# Patient Record
Sex: Female | Born: 1985 | Race: White | Hispanic: No | Marital: Married | Smoking: Never smoker
Health system: Southern US, Community
[De-identification: ages and names within clinical notes are randomized; demographics above are authoritative.]

## PROBLEM LIST (undated history)

## (undated) DIAGNOSIS — D131 Benign neoplasm of stomach: Secondary | ICD-10-CM

## (undated) DIAGNOSIS — F419 Anxiety disorder, unspecified: Secondary | ICD-10-CM

## (undated) DIAGNOSIS — F329 Major depressive disorder, single episode, unspecified: Secondary | ICD-10-CM

## (undated) DIAGNOSIS — F32A Depression, unspecified: Secondary | ICD-10-CM

## (undated) DIAGNOSIS — D649 Anemia, unspecified: Secondary | ICD-10-CM

## (undated) DIAGNOSIS — E162 Hypoglycemia, unspecified: Secondary | ICD-10-CM

## (undated) DIAGNOSIS — Z30013 Encounter for initial prescription of injectable contraceptive: Principal | ICD-10-CM

## (undated) DIAGNOSIS — T7840XA Allergy, unspecified, initial encounter: Secondary | ICD-10-CM

## (undated) HISTORY — DX: Anxiety disorder, unspecified: F41.9

## (undated) HISTORY — DX: Benign neoplasm of stomach: D13.1

## (undated) HISTORY — PX: INDUCED ABORTION: SHX677

## (undated) HISTORY — DX: Allergy, unspecified, initial encounter: T78.40XA

## (undated) HISTORY — DX: Major depressive disorder, single episode, unspecified: F32.9

## (undated) HISTORY — DX: Hypoglycemia, unspecified: E16.2

## (undated) HISTORY — DX: Anemia, unspecified: D64.9

## (undated) HISTORY — DX: Depression, unspecified: F32.A

## (undated) HISTORY — DX: Encounter for initial prescription of injectable contraceptive: Z30.013

---
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
## 2004-08-07 ENCOUNTER — Ambulatory Visit: Payer: Self-pay | Admitting: Family Medicine

## 2004-10-01 ENCOUNTER — Emergency Department: Payer: Self-pay | Admitting: Unknown Physician Specialty

## 2004-10-25 ENCOUNTER — Ambulatory Visit: Payer: Self-pay | Admitting: Family Medicine

## 2004-10-25 ENCOUNTER — Other Ambulatory Visit: Admission: RE | Admit: 2004-10-25 | Discharge: 2004-10-25 | Payer: Self-pay | Admitting: Family Medicine

## 2005-01-26 ENCOUNTER — Ambulatory Visit: Payer: Self-pay | Admitting: Family Medicine

## 2005-04-17 ENCOUNTER — Ambulatory Visit: Payer: Self-pay | Admitting: Family Medicine

## 2005-07-10 ENCOUNTER — Ambulatory Visit: Payer: Self-pay | Admitting: Family Medicine

## 2005-09-15 ENCOUNTER — Emergency Department: Payer: Self-pay | Admitting: Unknown Physician Specialty

## 2005-09-25 ENCOUNTER — Ambulatory Visit: Payer: Self-pay | Admitting: Family Medicine

## 2005-12-12 ENCOUNTER — Ambulatory Visit: Payer: Self-pay | Admitting: Family Medicine

## 2006-03-06 ENCOUNTER — Ambulatory Visit: Payer: Self-pay | Admitting: Family Medicine

## 2006-03-26 ENCOUNTER — Other Ambulatory Visit: Admission: RE | Admit: 2006-03-26 | Discharge: 2006-03-26 | Payer: Self-pay | Admitting: Family Medicine

## 2006-03-26 ENCOUNTER — Encounter: Payer: Self-pay | Admitting: Family Medicine

## 2006-03-26 ENCOUNTER — Ambulatory Visit: Payer: Self-pay | Admitting: Family Medicine

## 2006-04-05 ENCOUNTER — Encounter: Payer: Self-pay | Admitting: Family Medicine

## 2006-04-05 LAB — CONVERTED CEMR LAB: Pap Smear: NORMAL

## 2006-04-23 ENCOUNTER — Ambulatory Visit: Payer: Self-pay | Admitting: Family Medicine

## 2006-07-01 ENCOUNTER — Ambulatory Visit: Payer: Self-pay | Admitting: Family Medicine

## 2006-07-12 ENCOUNTER — Encounter: Payer: Self-pay | Admitting: Family Medicine

## 2006-07-12 DIAGNOSIS — E162 Hypoglycemia, unspecified: Secondary | ICD-10-CM | POA: Insufficient documentation

## 2006-07-12 DIAGNOSIS — D649 Anemia, unspecified: Secondary | ICD-10-CM | POA: Insufficient documentation

## 2006-07-14 ENCOUNTER — Encounter: Payer: Self-pay | Admitting: Family Medicine

## 2006-10-04 ENCOUNTER — Telehealth: Payer: Self-pay | Admitting: Family Medicine

## 2006-10-28 ENCOUNTER — Ambulatory Visit: Payer: Self-pay | Admitting: Family Medicine

## 2006-11-06 ENCOUNTER — Ambulatory Visit: Payer: Self-pay | Admitting: Family Medicine

## 2006-11-06 DIAGNOSIS — L255 Unspecified contact dermatitis due to plants, except food: Secondary | ICD-10-CM | POA: Insufficient documentation

## 2006-12-27 ENCOUNTER — Ambulatory Visit: Payer: Self-pay | Admitting: Family Medicine

## 2006-12-27 DIAGNOSIS — J029 Acute pharyngitis, unspecified: Secondary | ICD-10-CM | POA: Insufficient documentation

## 2006-12-27 LAB — CONVERTED CEMR LAB: Rapid Strep: NEGATIVE

## 2007-01-17 ENCOUNTER — Ambulatory Visit: Payer: Self-pay | Admitting: Family Medicine

## 2007-01-17 DIAGNOSIS — N926 Irregular menstruation, unspecified: Secondary | ICD-10-CM | POA: Insufficient documentation

## 2007-01-17 DIAGNOSIS — R350 Frequency of micturition: Secondary | ICD-10-CM | POA: Insufficient documentation

## 2007-01-17 LAB — CONVERTED CEMR LAB
Beta hcg, urine, semiquantitative: NEGATIVE
Bilirubin Urine: NEGATIVE
Blood in Urine, dipstick: NEGATIVE
Casts: 0 /lpf
Glucose, Urine, Semiquant: NEGATIVE
Ketones, urine, test strip: NEGATIVE
Mucus, UA: 0
Nitrite: NEGATIVE
RBC / HPF: 1
Specific Gravity, Urine: 1.015
Urine crystals, microscopic: 0 /hpf
Urobilinogen, UA: 0.2
WBC Urine, dipstick: NEGATIVE
Yeast, UA: 0
pH: 7

## 2007-01-18 ENCOUNTER — Encounter: Payer: Self-pay | Admitting: Family Medicine

## 2007-07-10 ENCOUNTER — Ambulatory Visit: Payer: Self-pay | Admitting: Family Medicine

## 2007-07-10 DIAGNOSIS — M549 Dorsalgia, unspecified: Secondary | ICD-10-CM | POA: Insufficient documentation

## 2007-07-11 ENCOUNTER — Encounter: Admission: RE | Admit: 2007-07-11 | Discharge: 2007-07-11 | Payer: Self-pay | Admitting: Family Medicine

## 2007-07-21 ENCOUNTER — Encounter: Payer: Self-pay | Admitting: Family Medicine

## 2007-07-31 ENCOUNTER — Encounter: Payer: Self-pay | Admitting: Family Medicine

## 2008-08-26 ENCOUNTER — Ambulatory Visit: Payer: Self-pay | Admitting: Family Medicine

## 2008-08-26 DIAGNOSIS — L919 Hypertrophic disorder of the skin, unspecified: Secondary | ICD-10-CM

## 2008-08-26 DIAGNOSIS — L909 Atrophic disorder of skin, unspecified: Secondary | ICD-10-CM | POA: Insufficient documentation

## 2008-08-26 DIAGNOSIS — B079 Viral wart, unspecified: Secondary | ICD-10-CM | POA: Insufficient documentation

## 2009-02-11 ENCOUNTER — Ambulatory Visit: Payer: Self-pay | Admitting: Family Medicine

## 2009-02-11 DIAGNOSIS — F4322 Adjustment disorder with anxiety: Secondary | ICD-10-CM | POA: Insufficient documentation

## 2009-02-15 ENCOUNTER — Telehealth: Payer: Self-pay | Admitting: Family Medicine

## 2009-02-15 ENCOUNTER — Encounter: Payer: Self-pay | Admitting: Family Medicine

## 2009-11-16 ENCOUNTER — Ambulatory Visit: Payer: Self-pay | Admitting: Family Medicine

## 2009-11-16 DIAGNOSIS — N63 Unspecified lump in unspecified breast: Secondary | ICD-10-CM | POA: Insufficient documentation

## 2009-11-23 ENCOUNTER — Encounter: Admission: RE | Admit: 2009-11-23 | Discharge: 2009-11-23 | Payer: Self-pay | Admitting: Family Medicine

## 2010-01-11 ENCOUNTER — Other Ambulatory Visit: Admission: RE | Admit: 2010-01-11 | Discharge: 2010-01-11 | Payer: Self-pay | Admitting: Family Medicine

## 2010-01-11 ENCOUNTER — Ambulatory Visit: Payer: Self-pay | Admitting: Family Medicine

## 2010-01-11 LAB — CONVERTED CEMR LAB
Bilirubin Urine: NEGATIVE
Casts: 0 /lpf
Glucose, Urine, Semiquant: NEGATIVE
Ketones, urine, test strip: NEGATIVE
Nitrite: NEGATIVE
Pap Smear: NORMAL
Specific Gravity, Urine: >=1.03
Urobilinogen, UA: 0.2
WBC Urine, dipstick: NEGATIVE
Yeast, UA: 0
pH: 5

## 2010-01-11 LAB — HM PAP SMEAR

## 2010-01-12 ENCOUNTER — Encounter: Payer: Self-pay | Admitting: Family Medicine

## 2010-01-12 LAB — CONVERTED CEMR LAB
ALT: 12 units/L (ref 0–35)
AST: 17 units/L (ref 0–37)
Albumin: 4 g/dL (ref 3.5–5.2)
Alkaline Phosphatase: 59 units/L (ref 39–117)
BUN: 10 mg/dL (ref 6–23)
Basophils Absolute: 0 10*3/uL (ref 0.0–0.1)
Basophils Relative: 0.6 % (ref 0.0–3.0)
Bilirubin, Direct: 0.1 mg/dL (ref 0.0–0.3)
CO2: 28 meq/L (ref 19–32)
Calcium: 9.1 mg/dL (ref 8.4–10.5)
Chloride: 104 meq/L (ref 96–112)
Cholesterol: 214 mg/dL — ABNORMAL HIGH (ref 0–200)
Creatinine, Ser: 0.7 mg/dL (ref 0.4–1.2)
Direct LDL: 126.1 mg/dL
Eosinophils Absolute: 0 10*3/uL (ref 0.0–0.7)
Eosinophils Relative: 0.6 % (ref 0.0–5.0)
GFR calc non Af Amer: 117.94 mL/min (ref >60)
Glucose, Bld: 80 mg/dL (ref 70–99)
HCT: 37.2 % (ref 36.0–46.0)
HDL: 74 mg/dL (ref >39.00)
Hemoglobin: 12.8 g/dL (ref 12.0–15.0)
Lymphocytes Relative: 21.4 % (ref 12.0–46.0)
Lymphs Abs: 1.1 10*3/uL (ref 0.7–4.0)
MCHC: 34.5 g/dL (ref 30.0–36.0)
MCV: 89.2 fL (ref 78.0–100.0)
Monocytes Absolute: 0.8 10*3/uL (ref 0.1–1.0)
Monocytes Relative: 15.6 % — ABNORMAL HIGH (ref 3.0–12.0)
Neutro Abs: 3.2 10*3/uL (ref 1.4–7.7)
Neutrophils Relative %: 61.8 % (ref 43.0–77.0)
Platelets: 236 10*3/uL (ref 150.0–400.0)
Potassium: 3.8 meq/L (ref 3.5–5.1)
RBC: 4.17 M/uL (ref 3.87–5.11)
RDW: 12.8 % (ref 11.5–14.6)
Sodium: 138 meq/L (ref 135–145)
TSH: 1.13 microintl units/mL (ref 0.35–5.50)
Total Bilirubin: 1 mg/dL (ref 0.3–1.2)
Total CHOL/HDL Ratio: 3
Total Protein: 6.8 g/dL (ref 6.0–8.3)
Triglycerides: 46 mg/dL (ref 0.0–149.0)
VLDL: 9.2 mg/dL (ref 0.0–40.0)
WBC: 5.2 10*3/uL (ref 4.5–10.5)

## 2010-01-19 LAB — CONVERTED CEMR LAB: Pap Smear: NEGATIVE

## 2010-05-04 ENCOUNTER — Ambulatory Visit
Admission: RE | Admit: 2010-05-04 | Discharge: 2010-05-04 | Payer: Self-pay | Source: Home / Self Care | Attending: Family Medicine | Admitting: Family Medicine

## 2010-05-04 ENCOUNTER — Encounter: Payer: Self-pay | Admitting: Family Medicine

## 2010-05-09 NOTE — Assessment & Plan Note (Signed)
Summary: PAP SMEAR AND CPX,CHECK FOR STD, uti/CLE   Vital Signs:  Patient profile:   25 year old female Height:      63 inches Weight:      127.50 pounds BMI:     22.67 Temp:     97.9 degrees F oral Pulse rate:   68 / minute Pulse rhythm:   regular BP sitting:   100 / 70  (left arm) Cuff size:   regular  Vitals Entered By: Lewanda Rife LPN (January 11, 2010 10:46 AM) CC: CPX LMP 12/08/09 UTI frequency of urine, and wants test for STD   History of Present Illness: here for wellness exam and gyn   wt is stable  bp good 100/70  hx of abn pap  pap 12/07- has been a while  needs pap today   Td 07 no flu shot today   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 - no pain from that   has some urinary frequency ua is borderlne today has frequency and big urge to go -- has urge and then does not need to go  once had incontinence some imp with cranberry pills and juice  symptoms are not all the way better  drinking lots of water too   is interested in std screen and hiv  no discharge or pain or other symptoms  no known exposure  wants to be safe  her partner has had vasectomy - no need for OC now   periods are no longer regular since depo  not very heavy or painful   is taking care of herself   Allergies (verified): No Known Drug Allergies  Past History:  Past Medical History: Last updated: 08/26/2008 hx of hypoglycemia   Past Surgical History: Last updated: 07/12/2006 Hx of abnormal pap Ab.  Family History: Last updated: 11/16/2009 mother DM  aunts (both M and P)- with breast cancer   Social History: Last updated: 11/16/2009 non smoker caffiene -- 1 cup of coffee per day   Risk Factors: Smoking Status: never (07/12/2006)  Review of Systems General:  Denies fatigue, fever, loss of appetite, and malaise. Eyes:  Denies blurring and eye irritation. CV:  Denies chest pain or discomfort, fatigue, lightheadness, and palpitations. Resp:  Denies cough and  shortness of breath. GI:  Denies abdominal pain, change in bowel habits, and nausea. GU:  Denies abnormal vaginal bleeding, discharge, dysuria, and urinary frequency. MS:  Denies joint pain, joint redness, and joint swelling. Derm:  Denies itching, lesion(s), poor wound healing, and rash. Neuro:  Denies numbness and tingling. Psych:  mood has been fairly good . Endo:  Denies cold intolerance, excessive thirst, excessive urination, and heat intolerance. Heme:  Denies abnormal bruising and bleeding.  Physical Exam  General:  Well-developed,well-nourished,in no acute distress; alert,appropriate and cooperative throughout examination Head:  normocephalic, atraumatic, and no abnormalities observed.   Eyes:  vision grossly intact, pupils equal, pupils round, and pupils reactive to light.  no conjunctival pallor, injection or icterus  Ears:  R ear normal and L ear normal.   Nose:  no nasal discharge.   Mouth:  pharynx pink and moist.   Neck:  supple with full rom and no masses or thyromegally, no JVD or carotid bruit  Chest Wall:  No deformities, masses, or tenderness noted. Breasts:  breast mass mobile and smooth on R at 9:00  this corresponds with adenoma seen on [place]  no other M, skin change/ or nipple d/c Lungs:  Normal respiratory effort, chest expands  symmetrically. Lungs are clear to auscultation, no crackles or wheezes. Heart:  Normal rate and regular rhythm. S1 and S2 normal without gallop, murmur, click, rub or other extra sounds. Abdomen:  Bowel sounds positive,abdomen soft and non-tender without masses, organomegaly or hernias noted. no suprapubic tenderness or fullness felt  Genitalia:  Normal introitus for age, no external lesions, no vaginal discharge, mucosa pink and moist, no vaginal or cervical lesions, no vaginal atrophy, no friaility or hemorrhage, normal uterus size and position, no adnexal masses or tenderness Msk:  no CVA tenderness  no acute joint changes  Pulses:  R and  L carotid,radial,femoral,dorsalis pedis and posterior tibial pulses are full and equal bilaterally Extremities:  No clubbing, cyanosis, edema, or deformity noted with normal full range of motion of all joints.   Neurologic:  sensation intact to light touch, gait normal, and DTRs symmetrical and normal.   Skin:  Intact without suspicious lesions or rashes Cervical Nodes:  No lymphadenopathy noted Inguinal Nodes:  No significant adenopathy Psych:  normal affect, talkative and pleasant    Impression & Recommendations:  Problem # 1:  HEALTH MAINTENANCE EXAM (ICD-V70.0) Assessment Comment Only reviewed health habits including diet, exercise and skin cancer prevention reviewed health maintenance list and family history  commended of good habits  lab today for wellness  Orders: Venipuncture (16109) TLB-Lipid Panel (80061-LIPID) TLB-BMP (Basic Metabolic Panel-BMET) (80048-METABOL) TLB-CBC Platelet - w/Differential (85025-CBCD) TLB-Hepatic/Liver Function Pnl (80076-HEPATIC) TLB-TSH (Thyroid Stimulating Hormone) (84443-TSH) T-HIV Antibody  (Reflex) ([id number]) T-RPR (Syphilis) (91478-29562)  Problem # 2:  ROUTINE GYNECOLOGICAL EXAMINATION (ICD-V72.31) Assessment: Comment Only annual exam with pap done remote hx of abn pap  will do hpv if abn  Problem # 3:  SCREENING EXAMINATION FOR VENEREAL DISEASE (ICD-V74.5) Assessment: Comment Only pt is asymptomatic and req screening  lab done  gon and chlam performed with pap smear  Orders: Venipuncture (13086) TLB-Lipid Panel (80061-LIPID) TLB-BMP (Basic Metabolic Panel-BMET) (80048-METABOL) TLB-CBC Platelet - w/Differential (85025-CBCD) TLB-Hepatic/Liver Function Pnl (80076-HEPATIC) TLB-TSH (Thyroid Stimulating Hormone) (84443-TSH) T-HIV Antibody  (Reflex) ([id number]) T-RPR (Syphilis) (28413-24401)  Problem # 4:  FREQUENCY, URINARY (ICD-788.41) Assessment: Deteriorated improved with cranberry pills and borderline ua  check ucx   check std tests  then tx if pos Orders: T-Culture, Urine (02725-36644) Specimen Handling (03474) UA Dipstick W/ Micro (manual) (81000)  Complete Medication List: 1)  Prozac 10 Mg Caps (Fluoxetine hcl) .... Take 1 tablet by mouth once a day 2)  Cranberry /mg  .... Take 1 tablet by mouth once a day  Patient Instructions: 1)  labs today  2)  continue healthy diet and exercise 3)  will adv you when pap and labs return  4)  will adv you urine culture returns 5)  keep drinking lots of water  6)  let me know if urine symptoms worsen   Current Allergies (reviewed today): No known allergies   Laboratory Results   Urine Tests  Date/Time Received: January 11, 2010 10:49 AM  Date/Time Reported: January 11, 2010 10:49 AM   Routine Urinalysis   Color: yellow Appearance: Cloudy Glucose: negative   (Normal Range: Negative) Bilirubin: negative   (Normal Range: Negative) Ketone: negative   (Normal Range: Negative) Spec. Gravity: >=1.030   (Normal Range: 1.003-1.035) Blood: trace-lysed   (Normal Range: Negative) pH: 5.0   (Normal Range: 5.0-8.0) Protein: trace   (Normal Range: Negative) Urobilinogen: 0.2   (Normal Range: 0-1) Nitrite: negative   (Normal Range: Negative) Leukocyte Esterace: negative   (Normal Range: Negative)  Urine  Microscopic WBC/HPF: 1-2 RBC/HPF: 0-1 Bacteria/HPF: few Mucous/HPF: few Epithelial/HPF: 0-1 Crystals/HPF: few Casts/LPF: 0 Yeast/HPF: 0 Other: 0

## 2010-05-09 NOTE — Assessment & Plan Note (Signed)
Summary: CHECK WARTS ON HAND,MMG REFERRAL   Vital Signs:  Patient profile:   25 year old female Height:      63 inches Weight:      127.75 pounds BMI:     22.71 Temp:     98.6 degrees F oral Pulse rate:   76 / minute Pulse rhythm:   regular BP sitting:   90 / 56  (left arm) Cuff size:   regular  Vitals Entered By: Lewanda Rife LPN (November 16, 2009 3:52 PM) CC: ck 2 warts on rt hand, also ck tag mole on neck and wants mammogram referral   History of Present Illness: has some new warts on hand-- 2 on R thumb   also skin tag on her neck   did well freezing these in the past   has had lumps in her breasts -- they stay   since 13 has had some breast lumps  thinks she has several  all on R side  no pain (are sore during period)  no nipple d/c or skin change     Allergies (verified): No Known Drug Allergies  Past History:  Past Medical History: Last updated: 08/26/2008 hx of hypoglycemia   Past Surgical History: Last updated: 07/12/2006 Hx of abnormal pap Ab.  Family History: Last updated: 11/16/2009 mother DM  aunts (both M and P)- with breast cancer   Social History: Last updated: 11/16/2009 non smoker caffiene -- 1 cup of coffee per day   Risk Factors: Smoking Status: never (07/12/2006)  Family History: mother DM  aunts (both M and P)- with breast cancer   Social History: non smoker caffiene -- 1 cup of coffee per day   Review of Systems General:  Denies fatigue, fever, and malaise. Eyes:  Denies eye irritation. CV:  Denies chest pain or discomfort and palpitations. Resp:  Denies cough and wheezing. GI:  Denies abdominal pain and change in bowel habits. GU:  Denies genital sores. MS:  Denies joint pain, joint redness, and joint swelling. Derm:  Complains of itching and lesion(s); denies poor wound healing and rash. Neuro:  Denies numbness and tingling. Endo:  Denies excessive thirst and excessive urination. Heme:  Denies abnormal bruising  and bleeding.  Physical Exam  General:  Well-developed,well-nourished,in no acute distress; alert,appropriate and cooperative throughout examination Head:  normocephalic, atraumatic, and no abnormalities observed.   Mouth:  pharynx pink and moist.   Neck:  supple with full rom and no masses or thyromegally, no JVD or carotid bruit  Breasts:  1-2 cm smooth mobile mass lat to R nipple  otherwise no M, skin change or nipple d/c in either breast  breasts are very dense  Lungs:  Normal respiratory effort, chest expands symmetrically. Lungs are clear to auscultation, no crackles or wheezes. Heart:  Normal rate and regular rhythm. S1 and S2 normal without gallop, murmur, click, rub or other extra sounds. Msk:  no acute joint changes  Neurologic:  sensation intact to light touch.   Skin:  irritated skin tag on L side of neck  tx with cryo - tol well   R thumb 2 simple warts  were debrided in sterile fashion  cryo with liquid nitrogen times 3 - full freeze and thaw - pt tolerated well Cervical Nodes:  No lymphadenopathy noted Psych:  normal affect, talkative and pleasant    Impression & Recommendations:  Problem # 1:  BREAST MASS, RIGHT (ICD-611.72) Assessment New lump felt laterally by nipple -may be larger per pt ordered  US and update Orders: Radiology Referral (Radiology)  Problem # 2:  UNSPECIFIED VIRAL WARTS (ICD-078.10) Assessment: New 2 warts on R thumb- tx with cryo tx and debridement pt tolerated well  aftercare disc  Problem # 3:  SKIN TAG (ICD-701.9) Assessment: Unchanged on L neck- is irritated and bothersome  tx with cryotx and well tolerated  Complete Medication List: 1)  Prozac 10 Mg Caps (Fluoxetine hcl) .... Take 1 tablet by mouth once a day  Other Orders: Wart Destruct <14 (17110)  Patient Instructions: 1)  keep warts clean and dry  2)  antibiotic ointment on those and neck tag are ok while healing  3)  we will set up breast imaging appt at check out    4)  then I will update you with a result   Current Allergies (reviewed today): No known allergies

## 2010-05-09 NOTE — Assessment & Plan Note (Signed)
Summary: BACK PAIN/RBH   Vital Signs:  Patient Profile:   25 Years Old Female Height:     63 inches (160.02 cm) Weight:      131 pounds Temp:     98.2 degrees F oral Pulse rate:   76 / minute Pulse rhythm:   regular BP sitting:   110 / 70  (left arm) Cuff size:   regular  Vitals Entered By: Lowella Petties (July 10, 2007 3:54 PM)                 Chief Complaint:  Pain mid lower back.  History of Present Illness: this is an old back problem started 6 mo ago- intermittent low back pain when bending over has progressed to hurting all the time in past 2 weeks- even when still some sharp some dull pain  postion no longer makes difference- but really hurts to walk picking up heavy is difficult  describes area as "where spine and tailbone cross" no pain in legs no numbness in legs or feet, no weakness no change in urination- no incontinence no fever- feels fine  has not taken any medcine -trying not to since it hurts all time (advil did not help to begin with) helps a little to lie down  no better or worse in am- but gets more stiff the more she sits still  LMP 2 mo ago no chance pregnant      Current Allergies: No known allergies    Family History:    Reviewed history from 07/12/2006 and no changes required:       mother DM  Social History:    non smoker    Review of Systems  General      Denies malaise, weakness, and weight loss.  CV      Denies chest pain or discomfort and palpitations.  Resp      Denies cough.  GI      Denies nausea and vomiting.  GU      Denies dysuria and urinary frequency.  MS      Denies joint pain, joint redness, and joint swelling.  Derm      Denies changes in color of skin and rash.  Neuro      Denies numbness and weakness.  Psych      mood is ok    Physical Exam  General:     Well-developed,well-nourished,in no acute distress; alert,appropriate and cooperative throughout examination Head:  normocephalic, atraumatic, and no abnormalities observed.   Eyes:     vision grossly intact, pupils equal, and pupils round.   Neck:     nl rom without tenderness Chest Wall:     No deformities, masses, or tenderness noted. Lungs:     Normal respiratory effort, chest expands symmetrically. Lungs are clear to auscultation, no crackles or wheezes. Heart:     Normal rate and regular rhythm. S1 and S2 normal without gallop, murmur, click, rub or other extra sounds. Abdomen:     soft and non-tender.  no suprapubic tenderness or fullness  Msk:     no scolisois or lordosis some LS tenderness [id number] flex 90 deg, ext 10 deg with pain nl twist some pain on L lat bend SLR nl  nl rom hips Pulses:     R and L carotid,radial,femoral,dorsalis pedis and posterior tibial pulses are full and equal bilaterally Extremities:     No clubbing, cyanosis, edema, or deformity noted with normal full range of motion of all joints.  Neurologic:     strength normal in all extremities, sensation intact to light touch, gait normal, and DTRs symmetrical and normal.   Skin:     Intact without suspicious lesions or rashes Cervical Nodes:     No lymphadenopathy noted Psych:     normal affect, talkative and pleasant     Impression & Recommendations:  Problem # 1:  BACK PAIN (ICD-724.5) Assessment: New low back pain- ongoing and consistent with sprain/strain- but also some bony tenderness on exam will try heat- and handout given on low back pain naproxen two times a day and flexeril as needed x ray and update Orders: Diagnostic X-Ray/Fluoroscopy (Diagnostic X-Ray/Flu)  Her updated medication list for this problem includes:    Naprosyn 500 Mg Tabs (Naproxen) .Marland Kitchen... 1 by mouth two times a day with food for 2 weeks    Flexeril 10 Mg Tabs (Cyclobenzaprine hcl) .Marland Kitchen... 1 by mouth three times a day as needed back pain/spasm   Complete Medication List: 1)  Naprosyn 500 Mg Tabs (Naproxen) .Marland Kitchen.. 1 by mouth two  times a day with food for 2 weeks 2)  Flexeril 10 Mg Tabs (Cyclobenzaprine hcl) .Marland Kitchen.. 1 by mouth three times a day as needed back pain/spasm   Patient Instructions: 1)  we will refer you for low back x ray when you check out- in [place] 2)  use some heat on your back 3)  try not to do any heavy lifting or stay still for a long time  4)  use the naproxen with food twice daily for 2 weeks 5)  flexeril (muscle relaxer)- may help more severe pain- but use caution because it can sedate 6)  we will make a plan when x ray results return    Prescriptions: FLEXERIL 10 MG  TABS (CYCLOBENZAPRINE HCL) 1 by mouth three times a day as needed back pain/spasm  #30 x 0   Entered and Authorized by:   Judith Part MD   Signed by:   Judith Part MD on 07/10/2007   Method used:   Print then Give to Patient   RxID:   [id number] NAPROSYN 500 MG  TABS (NAPROXEN) 1 by mouth two times a day with food for 2 weeks  #60 x 0   Entered and Authorized by:   Judith Part MD   Signed by:   Judith Part MD on 07/10/2007   Method used:   Print then Give to Patient   RxID:   [id number]  ] Prior Medications: NAPROSYN 500 MG  TABS (NAPROXEN) 1 by mouth two times a day with food for 2 weeks FLEXERIL 10 MG  TABS (CYCLOBENZAPRINE HCL) 1 by mouth three times a day as needed back pain/spasm Current Allergies: No known allergies

## 2010-05-17 NOTE — Assessment & Plan Note (Signed)
Summary: MMR/RI  Nurse Visit   Allergies: No Known Drug Allergies  Immunizations Administered:  MMR Vaccine # 2:    Vaccine Type: MMR    Site: left deltoid    Mfr: Merck    Dose: 0.5 ml    Route: Vanduser    Given by: Lewanda Rife LPN    Exp. Date: 06/23/2011    Lot #: [id number]    VIS given: 06/20/06 version given May 04, 2010.  Orders Added: 1)  MMR Vaccine SQ [90707] 2)  Admin 1st Vaccine [32440]

## 2010-05-17 NOTE — Miscellaneous (Signed)
Summary: Immunizations  Immunizations   Imported By: Kassie Mends 05/12/2010 10:39:01  _____________________________________________________________________  External Attachment:    Type:   Image     Comment:   External Document

## 2010-08-16 ENCOUNTER — Telehealth: Payer: Self-pay | Admitting: *Deleted

## 2010-08-16 ENCOUNTER — Encounter: Payer: Self-pay | Admitting: Family Medicine

## 2010-08-16 NOTE — Telephone Encounter (Signed)
Pt is asking for a note for work stating that she needs to use her portable stand up fan everyday, all day.  She works at the [employer] and it is very hot in her work area.  She says she needs this to avoid heat exhaustion, and they wont let her use it unless she has a work note.  Please call her when the note is ready.

## 2010-08-16 NOTE — Telephone Encounter (Signed)
Done and in IN box 

## 2010-08-16 NOTE — Telephone Encounter (Signed)
Patient notified as instructed by telephone.Letter left at front desk for pick up.

## 2010-11-13 ENCOUNTER — Telehealth: Payer: Self-pay | Admitting: *Deleted

## 2010-11-13 NOTE — Telephone Encounter (Signed)
The patient says she had secured a gym membership to work out at [employer] and that now she is having significant back problems that is not allowing her to work out.  She has been seen here for this problem and has been sent to a specialist.  The gym is holding her financially responsible for her membership unless she gets a form filled out from her MD saying that she is disabled and unable to use the membership.  She has the form and would like to know if you would fill it out or if she needs an appt with you before she brings in the form.

## 2010-11-13 NOTE — Telephone Encounter (Signed)
Patient notified as instructed by telephone. Pt scheduled appt with Dr Milinda Antis 11/20/10 at 11:45am and will bring form and [facility] records with pt. If back pain starts to bother her before Monday pt will call back.

## 2010-11-13 NOTE — Telephone Encounter (Signed)
Need to follow up for that please Also make sure we have the notes from doctor we referred to  thanks

## 2010-11-15 ENCOUNTER — Encounter: Payer: Self-pay | Admitting: Family Medicine

## 2010-11-20 ENCOUNTER — Ambulatory Visit (INDEPENDENT_AMBULATORY_CARE_PROVIDER_SITE_OTHER): Payer: BC Managed Care – PPO | Admitting: Family Medicine

## 2010-11-20 ENCOUNTER — Encounter: Payer: Self-pay | Admitting: Family Medicine

## 2010-11-20 VITALS — BP 104/66 | HR 80 | Temp 98.6°F | Ht 63.0 in | Wt 142.2 lb

## 2010-11-20 DIAGNOSIS — M549 Dorsalgia, unspecified: Secondary | ICD-10-CM

## 2010-11-20 NOTE — Patient Instructions (Signed)
Please make appt with Dr Patsy Lager for back pain up front Use gentle heat and stretch as much as you can  Update me in meantime if symptoms worsen

## 2010-11-20 NOTE — Assessment & Plan Note (Addendum)
Ongoing since 2009 -- but now worse when she tried to work out at [employer] (machines and cardio) Now constant pain - moreso in piriformis area No neuro s/s Rev ortho notes 2009- was briefly  Of pars def but reassuring MRI  Pt has not done PT  Will ref to sport [facility] Forms filled out to get out of gym contract now

## 2010-11-20 NOTE — Progress Notes (Signed)
Subjective:    Patient ID: Karen Sanchez, female    DOB: 03/18/1986, 25 y.o.   MRN: [id number]  HPI Signed up for [employer] just a month ago - in a 2 year contract  Every time she goes her low back pain flares up really bad  Any physical activity bothers it   Also bothers her a lot at work - she walks all day  Also going up stairs  Chronic low back pain - on and off - feels like on the bone -- both sides   Thought exercise may help her back but she was wrong  Was running and not using a trainer  Then used treadmill and bikes   In past [facility] -- did not find a reason for it  xr - showed ? Pars deficit but MRI was normal   She has learned to deal with the pain  Lateral bend hurts the wost Works at the [employer]- does have to lift mail She wants to get out of her gym contract and consider another form of exercise  Patient Active Problem List  Diagnoses  . Viral warts, unspecified  . HYPOGLYCEMIA  . ANEMIA  . ADJUSTMENT DISORDER WITH ANXIETY  . SORE THROAT  . BREAST MASS, RIGHT  . IRREGULAR MENSTRUATION  . DERMATITIS, CONTACT, DUE TO PLANTS  . SKIN TAG  . BACK PAIN  . FREQUENCY, URINARY   Past Medical History  Diagnosis Date  . Hypoglycemia    No past surgical history on file. History  Substance Use Topics  . Smoking status: Never Smoker   . Smokeless tobacco: Not on file  . Alcohol Use: No   Family History  Problem Relation Age of Onset  . Diabetes Mother   . Cancer Maternal Aunt     breast  . Cancer Paternal Aunt     breast   No Known Allergies Current Outpatient Prescriptions on File Prior to Visit  Medication Sig Dispense Refill  . FLUoxetine (PROZAC) 10 MG capsule Take 10 mg by mouth daily.        . Vitamins C E (CRANBERRY CONCENTRATE PO) Take 1 tablet by mouth daily.             Review of Systems Review of Systems  Constitutional: Negative for fever, appetite change, fatigue and unexpected weight change.  Eyes: Negative for pain and  visual disturbance.  Respiratory: Negative for cough and shortness of breath.   Cardiovascular: Negative.  For cp or palpitations Gastrointestinal: Negative for nausea, diarrhea and constipation.  Genitourinary: Negative for urgency and frequency.  Skin: Negative for pallor. pr rasj  MSK pos for low back pain  Neurological: Negative for weakness, light-headedness, numbness and headaches.  Hematological: Negative for adenopathy. Does not bruise/bleed easily.  Psychiatric/Behavioral: Negative for dysphoric mood. The patient is not nervous/anxious.          Objective:   Physical Exam  Constitutional: She appears well-developed and well-nourished. No distress.  HENT:  Head: Normocephalic and atraumatic.  Mouth/Throat: Oropharynx is clear and moist.  Eyes: Conjunctivae and EOM are normal. Pupils are equal, round, and reactive to light.  Neck: Normal range of motion. Neck supple. No thyromegaly present.       No bony tenderness  Cardiovascular: Normal rate, regular rhythm and normal heart sounds.   Pulmonary/Chest: Effort normal and breath sounds normal. No respiratory distress. She has no wheezes.  Abdominal: Soft. Bowel sounds are normal.  Musculoskeletal: She exhibits no edema.  Mild bony tenderness over lower lumbar vertebrae Also muscular tenderness lateral lumbar both sides to the piriformis muscle Nl rom hips Neg SLR Gait is labored Flex spine 30 deg with pain but full extension Pain to flex laterally as well  Lymphadenopathy:    She has no cervical adenopathy.  Neurological: She is alert. She has normal strength and normal reflexes. No sensory deficit. She exhibits normal muscle tone. Coordination normal.  Skin: Skin is warm and dry. No rash noted. No erythema. No pallor.  Psychiatric: She has a normal mood and affect.          Assessment & Plan:

## 2010-11-22 ENCOUNTER — Institutional Professional Consult (permissible substitution): Payer: BC Managed Care – PPO | Admitting: Family Medicine

## 2010-11-27 ENCOUNTER — Telehealth: Payer: Self-pay | Admitting: Family Medicine

## 2010-11-29 ENCOUNTER — Ambulatory Visit (INDEPENDENT_AMBULATORY_CARE_PROVIDER_SITE_OTHER): Payer: BC Managed Care – PPO | Admitting: Family Medicine

## 2010-11-29 ENCOUNTER — Encounter: Payer: Self-pay | Admitting: Family Medicine

## 2010-11-29 VITALS — BP 110/70 | HR 74 | Temp 98.5°F | Ht 63.0 in | Wt 141.8 lb

## 2010-11-29 DIAGNOSIS — M533 Sacrococcygeal disorders, not elsewhere classified: Secondary | ICD-10-CM

## 2010-11-29 DIAGNOSIS — M999 Biomechanical lesion, unspecified: Secondary | ICD-10-CM

## 2010-11-29 DIAGNOSIS — M549 Dorsalgia, unspecified: Secondary | ICD-10-CM

## 2010-11-29 NOTE — Progress Notes (Signed)
Subjective:    Patient ID: Karen Sanchez, female    DOB: April 30, 1985, 25 y.o.   MRN: [id number]  HPI  Karen Sanchez, a 25 y.o. female presents today in the office for the following:    Pleasant patient - Dr. Milinda Antis requested a consultation regarding back pain.   Since 2009 - the patient has been having back pain off and on since this time. In the past, several years ago, she saw Dr. August Saucer from [facility] orthopedics. She did do some oral anti-inflammatories and some muscle relaxants, which made her fairly drowsy. Initially on her plain films, there was some can serve for possible pars defect, but this was followed up on MRI, and that was  Not evident. There is no evidence of pars defect or T2 signal in this area on her MRI of her spine.  Back pain sensations are there about 4 days a week.  Sometimes will get disoriented with balance Sometimes will make her cry. Feels better to stand than sit. Always around in the same area. Sometimes in her bottom area. Pretty much is there. Almost anything can make it worse.   Has done some heating pad. 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  Goes to [employer]  --- wants to do 5th grade.   At least twice a week Walk or will run. No more than 2 miles. Recently she has not done this as much do to time limitations, but in the past, she would do some  Run walking for a couple of miles several times a week.  Denies any numbness or tingling. Denies any bowel or bladder incontinence. Denies any focal weakness.  Currently points to the area at the [facility] joint bilaterally. This is the area of primary pain.  She has not tried any chiropractic manipulation, acupuncture, physical therapy, rubs or physical modalities. He does minimal core or abdominal work  The PMH, PSH, Social History, Family History, Medications, and allergies have been reviewed in [facility], and have been updated if relevant.  Review of Systems REVIEW OF SYSTEMS  GEN: No fevers, chills.  Nontoxic. Primarily MSK c/o today. MSK: Detailed in the HPI GI: tolerating PO intake without difficulty Neuro: No numbness, parasthesias, or tingling associated. Otherwise the pertinent positives of the ROS are noted above.      Objective:   Physical Exam   Physical Exam  Blood pressure 110/70, pulse 74, temperature 98.5 F (36.9 C), temperature source Oral, height 5\' 3"  (1.6 m), weight 141 lb 12.8 oz (64.32 kg), SpO2 100.00%.  Gen: Well-developed,well-nourished,in no acute distress; alert,appropriate and cooperative throughout examination HEENT: Normocephalic and atraumatic without obvious abnormalities.  Ears, externally no deformities Pulm: Breathing comfortably in no respiratory distress Range of motion at  the waist: Flexion: normal Extension: normal Lateral bending: normal Rotation: all normal  No echymosis or edema Rises to examination table with no difficulty Gait: non antalgic  Inspection/Deformity: N Paraspinus T: NT  B Ankle Dorsiflexion (L5,4): 5/5 B Great Toe Dorsiflexion (L5,4): 5/5 Heel Walk (L5): WNL Toe Walk (S1): WNL Rise/Squat (L4): WNL  SENSORY B Medial Foot (L4): WNL B Dorsum (L5): WNL B Lateral (S1): WNL Light Touch: WNL Pinprick: WNL  REFLEXES Knee (L4): 2+ Ankle (S1): 2+  B SLR, seated: neg B SLR, supine: neg B FABER: neg B Reverse FABER: neg B Greater Troch: NT B Log Roll: neg B Stork: NT B Sciatic Notch: markedly tender to palpation bilaterally. Leg Lengths: equal       Assessment &  Plan:   1. SI (sacroiliac) joint dysfunction   2. BACK PAIN    Recommendations:  Interesting case. Right now and historically her SI joints or the point of maximal pain. On her, you can feel these well. She really has no functional symptoms around her paraspinous musculature and spinal erector musculature which is more common. 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  Reviewed some self SI joint maneuvers and stretches  For the patient to do as well as some poor conditioning. She is very young and healthy, and I think that she could push this pretty hard on her own. In SI joint dysfunction, there is very good evidence that chiropractic manipulation can be very helpful, and I recommended that the patient see a chiropractor. She is going to check with her insurance. I recommended that she see [facility] in [place] if she can.   If that is not an option from her insurance standpoint, I think it doing some basic Thea Silversmith protocol rehabilitation with the spine therapists would be a good idea as well, and I asked him to do some SI joint manipulation in that case.  Recheck with me in 6 weeks. If she is still doing poorly, think that he would be a reasonable try to see if a SI joint injection would help

## 2010-11-29 NOTE — Patient Instructions (Signed)
Sacroiliac Joint Mobilization and Rehab 1. Work on pretzel stretching, shoulder back and leg draped in front. 3-5 sets, 30 sec.. 2. hip abductor rotations. standing, hip flexion and rotation outward then inward. 3 sets, 15 reps. when can do comfortably, add ankle weights starting at 2 pounds.  3. cross over stretching - shoulder back to ground, same side leg crossover. 3-5 sets for 30 min..  4. rolling up and back knees to chest and rocking.  Ab exercises 3 x a week  CALL INSURANCE ABOUT CHIROPRACTOR  F/u in 6 weeks

## 2010-11-30 DIAGNOSIS — M533 Sacrococcygeal disorders, not elsewhere classified: Secondary | ICD-10-CM | POA: Insufficient documentation

## 2010-12-13 ENCOUNTER — Telehealth: Payer: Self-pay | Admitting: *Deleted

## 2010-12-13 NOTE — Telephone Encounter (Signed)
Pt has dropped off a form that she needs completed to have her gym membership cancelled.  Form is on your shelf.

## 2010-12-13 NOTE — Telephone Encounter (Signed)
Patient notified as instructed by telephone.completed form left at front desk.Copy of form and letter sent to be scanned.

## 2010-12-13 NOTE — Telephone Encounter (Signed)
Done and in IN box No charge - it was just a signature Please copy to scan also

## 2011-01-17 ENCOUNTER — Emergency Department (HOSPITAL_COMMUNITY)
Admission: EM | Admit: 2011-01-17 | Discharge: 2011-01-18 | Disposition: A | Discharge status: Home / Self Care | Payer: BC Managed Care – PPO | Attending: Emergency Medicine | Admitting: Emergency Medicine

## 2011-01-17 DIAGNOSIS — J069 Acute upper respiratory infection, unspecified: Secondary | ICD-10-CM | POA: Insufficient documentation

## 2011-01-17 DIAGNOSIS — R0989 Other specified symptoms and signs involving the circulatory and respiratory systems: Secondary | ICD-10-CM | POA: Insufficient documentation

## 2011-01-17 DIAGNOSIS — IMO0001 Reserved for inherently not codable concepts without codable children: Secondary | ICD-10-CM | POA: Insufficient documentation

## 2011-01-17 DIAGNOSIS — H9209 Otalgia, unspecified ear: Secondary | ICD-10-CM | POA: Insufficient documentation

## 2011-01-17 DIAGNOSIS — R0602 Shortness of breath: Secondary | ICD-10-CM | POA: Insufficient documentation

## 2011-01-17 DIAGNOSIS — R0609 Other forms of dyspnea: Secondary | ICD-10-CM | POA: Insufficient documentation

## 2011-01-17 DIAGNOSIS — R059 Cough, unspecified: Secondary | ICD-10-CM | POA: Insufficient documentation

## 2011-01-17 DIAGNOSIS — J3489 Other specified disorders of nose and nasal sinuses: Secondary | ICD-10-CM | POA: Insufficient documentation

## 2011-01-17 DIAGNOSIS — R509 Fever, unspecified: Secondary | ICD-10-CM | POA: Insufficient documentation

## 2011-01-17 DIAGNOSIS — R5381 Other malaise: Secondary | ICD-10-CM | POA: Insufficient documentation

## 2011-01-17 DIAGNOSIS — R05 Cough: Secondary | ICD-10-CM | POA: Insufficient documentation

## 2011-01-17 DIAGNOSIS — R070 Pain in throat: Secondary | ICD-10-CM | POA: Insufficient documentation

## 2011-01-18 ENCOUNTER — Emergency Department (HOSPITAL_COMMUNITY): Payer: BC Managed Care – PPO

## 2011-01-18 ENCOUNTER — Telehealth: Payer: Self-pay | Admitting: *Deleted

## 2011-01-18 NOTE — Telephone Encounter (Signed)
Patient called and said she has the flu. She said she had been using Afrin to help with head congestion. She called and said she had a non stop nose bleed now and didn't know what to do. I advised her to pinch the bridge of her nose and lean forward. I also advised to use an ice pack over her nose. She said she has been having headaches recently as well, which she wasn't sure if they were related to congestion or not. I advised to go to [facility] or ER to make sure it wasn't her BP. I told her that her nose may also require packing which we don't have to capability to do here. She verbalized understanding and said she would go get evaluated.   Sent to Dr Sharen Hones in Dr. Royden Purl absence and to Dr. Milinda Antis for [facility].

## 2011-01-18 NOTE — Telephone Encounter (Signed)
Agree.  If bleeding past 15 min, would need evaluation. Afrin usually helps stop bleed, not cause bleed.

## 2011-01-23 ENCOUNTER — Ambulatory Visit (INDEPENDENT_AMBULATORY_CARE_PROVIDER_SITE_OTHER): Payer: BC Managed Care – PPO | Admitting: Family Medicine

## 2011-01-23 ENCOUNTER — Encounter: Payer: Self-pay | Admitting: Family Medicine

## 2011-01-23 VITALS — BP 116/72 | HR 80 | Temp 98.1°F | Wt 136.2 lb

## 2011-01-23 DIAGNOSIS — J329 Chronic sinusitis, unspecified: Secondary | ICD-10-CM

## 2011-01-23 MED ORDER — AMOXICILLIN-POT CLAVULANATE 875-125 MG PO TABS: 1.0000 | ORAL_TABLET | Freq: Two times a day (BID) | ORAL | Status: AC

## 2011-01-23 NOTE — Progress Notes (Signed)
  Subjective:    Patient ID: Karen Sanchez, female    DOB: 28-Oct-1985, 25 y.o.   MRN: [id number]  HPI CC: f/u ER visit  sxs started last Monday (9 days ago) with head and sinus congestion, gradually getting worse.  Went to ER on Wednesday (records reviewed), 2/2 trouble with breathing and swollen throat.  Given shot (?prednisone).  Started on afrin nasal spray - caused nose bleed so stopped.  Placed on azithromycin and given tussionex which made her dizzy so stopped.  CXR normal.  Head congestion, nose drainage, ears popping, poor appetite, tired and fatigued continued.  + myalgia/arthralgia/body aches.  Cough about same, some mucous.  Significant RN with mucous and some blood tinged.  No frank epistaxis.    No fevers/chills, rashes, vomiting.  No smokers at home, no sick contacts.  No h/o asthma, seasonal allergies.  Review of Systems Per HPI    Objective:   Physical Exam  Nursing note and vitals reviewed. Constitutional: She appears well-developed and well-nourished. No distress.  HENT:  Head: Normocephalic and atraumatic.  Right Ear: Hearing, tympanic membrane, external ear and ear canal normal.  Left Ear: Hearing, tympanic membrane, external ear and ear canal normal.  Nose: Mucosal edema and rhinorrhea present. Right sinus exhibits maxillary sinus tenderness. Right sinus exhibits no frontal sinus tenderness. Left sinus exhibits maxillary sinus tenderness. Left sinus exhibits no frontal sinus tenderness.  Mouth/Throat: Uvula is midline, oropharynx is clear and moist and mucous membranes are normal. No oropharyngeal exudate, posterior oropharyngeal edema, posterior oropharyngeal erythema or tonsillar abscesses.       mild max sinus pressure  Eyes: Conjunctivae and EOM are normal. Pupils are equal, round, and reactive to light. No scleral icterus.  Neck: Normal range of motion. Neck supple.  Cardiovascular: Normal rate, regular rhythm, normal heart sounds and intact distal pulses.   No  murmur heard. Pulmonary/Chest: Effort normal and breath sounds normal. No respiratory distress. She has no wheezes. She has no rales.  Musculoskeletal: She exhibits no edema.  Lymphadenopathy:    She has no cervical adenopathy.  Skin: Skin is warm and dry. No rash noted.  Psychiatric: She has a normal mood and affect.          Assessment & Plan:

## 2011-01-23 NOTE — Assessment & Plan Note (Signed)
Recently treatd with zpack. Given progression of sxs and duration, anticipate bacterial sinusitis.  will treat with augmentin, recommended mucinex. Update if not improving as expected.

## 2011-01-23 NOTE — Patient Instructions (Signed)
You have a sinus infection. Take medicine as prescribed: augmentin twice daily for 10 days. Push fluids and plenty of rest. May use simple mucinex with plenty of fluid to help mobilize mucous. Return if fever >101.5, trouble opening/closing mouth, difficulty swallowing, or worsening.  Update [place] if not improving as expected.

## 2012-02-01 ENCOUNTER — Ambulatory Visit (INDEPENDENT_AMBULATORY_CARE_PROVIDER_SITE_OTHER): Payer: BC Managed Care – PPO | Admitting: Family Medicine

## 2012-02-01 ENCOUNTER — Encounter: Payer: Self-pay | Admitting: Family Medicine

## 2012-02-01 VITALS — BP 96/68 | HR 76 | Temp 97.9°F | Ht 63.0 in | Wt 135.0 lb

## 2012-02-01 DIAGNOSIS — Z3049 Encounter for surveillance of other contraceptives: Secondary | ICD-10-CM

## 2012-02-01 DIAGNOSIS — Z3042 Encounter for surveillance of injectable contraceptive: Secondary | ICD-10-CM | POA: Insufficient documentation

## 2012-02-01 DIAGNOSIS — J019 Acute sinusitis, unspecified: Secondary | ICD-10-CM

## 2012-02-01 LAB — POCT URINE PREGNANCY: Preg Test, Ur: NEGATIVE

## 2012-02-01 MED ORDER — MEDROXYPROGESTERONE ACETATE 150 MG/ML IM SUSP
150.0000 mg | Freq: Once | INTRAMUSCULAR | Status: AC
  Administered 2012-02-01: 150 mg via INTRAMUSCULAR

## 2012-02-01 MED ORDER — FLUTICASONE PROPIONATE 50 MCG/ACT NA SUSP: 2.0000 | Freq: Every day | NASAL | Status: DC

## 2012-02-01 MED ORDER — AMOXICILLIN 500 MG PO CAPS: 500.0000 mg | ORAL_CAPSULE | Freq: Three times a day (TID) | ORAL | Status: DC

## 2012-02-01 NOTE — Patient Instructions (Addendum)
Take amoxicillin for sinus infection flonase nasal spray to open up ears  Drink lots of fluids First depo shot today  On the way out schedule a physical with gyn exam / pap

## 2012-02-01 NOTE — Assessment & Plan Note (Signed)
With uri and allergies- pt is prone to these Also some ETD tx with amox and also flonase Disc symptomatic care - see instructions on AVS  Update if not starting to improve in a week or if worsening

## 2012-02-01 NOTE — Progress Notes (Signed)
Subjective:    Patient ID: Karen Sanchez, female    DOB: 07/10/1985, 26 y.o.   MRN: [id number]  HPI Here for ear pain and desiring depo provera  Both ears hurt - 3-4 days Post nasal drip / throat hurts  No fever  No chills or aches Some runny / stuffy nose Some sinus pain above eyes No purulent drainage   Wants to start depo  LMP was early this month  No chance pregnant Has been on depo before - for 5 years , not a lot of side effects Tends to stop her period entirely   Has not had pap in a while - will schedule that   Not a smoker   Patient Active Problem List  Diagnosis  . HYPOGLYCEMIA  . ANEMIA  . ADJUSTMENT DISORDER WITH ANXIETY  . IRREGULAR MENSTRUATION  . SKIN TAG  . BACK PAIN  . FREQUENCY, URINARY  . SI (sacroiliac) joint dysfunction  . Sinusitis   Past Medical History  Diagnosis Date  . Hypoglycemia    No past surgical history on file. History  Substance Use Topics  . Smoking status: Never Smoker   . Smokeless tobacco: Not on file  . Alcohol Use: No   Family History  Problem Relation Age of Onset  . Diabetes Mother   . Cancer Maternal Aunt     breast  . Cancer Paternal Aunt     breast   No Known Allergies Current Outpatient Prescriptions on File Prior to Visit  Medication Sig Dispense Refill  . sertraline (ZOLOFT) 100 MG tablet Take 1 tablet by mouth daily.      . traZODone (DESYREL) 50 MG tablet Take 1 tablet by mouth at bedtime as needed.          Review of Systems Review of Systems  Constitutional: Negative for fever, appetite change, fatigue and unexpected weight change.  Eyes: Negative for pain and visual disturbance.  ENT pos for ear pain and sinus pain and cong Respiratory: Negative for wheeze and shortness of breath.   Cardiovascular: Negative for cp or palpitations    Gastrointestinal: Negative for nausea, diarrhea and constipation.  Genitourinary: Negative for urgency and frequency.  Skin: Negative for pallor or rash     Neurological: Negative for weakness, light-headedness, numbness and headaches.  Hematological: Negative for adenopathy. Does not bruise/bleed easily.  Psychiatric/Behavioral: Negative for dysphoric mood. The patient is not nervous/anxious.         Objective:   Physical Exam  Constitutional: She appears well-developed and well-nourished. No distress.  HENT:  Head: Normocephalic and atraumatic.  Left Ear: External ear normal.  Mouth/Throat: Oropharynx is clear and moist. No oropharyngeal exudate.       TMs clear but slt dull (small eff) Nares are injected and congested  Bilateral maxillary sinus tenderness  Eyes: Conjunctivae normal and EOM are normal. Pupils are equal, round, and reactive to light. Right eye exhibits no discharge. Left eye exhibits no discharge.  Neck: Normal range of motion. Neck supple.  Cardiovascular: Normal rate and regular rhythm.   Pulmonary/Chest: Effort normal and breath sounds normal. No respiratory distress.  Abdominal: Soft. Bowel sounds are normal. She exhibits no distension and no mass. There is no tenderness.       No suprapubic tenderness or fullness    Musculoskeletal: She exhibits no edema.  Lymphadenopathy:    She has no cervical adenopathy.  Neurological: She is alert. No cranial nerve deficit.  Skin: Skin is warm and dry. No rash noted.  No erythema.  Psychiatric: She has a normal mood and affect.          Assessment & Plan:

## 2012-02-01 NOTE — Assessment & Plan Note (Signed)
Urine preg neg and first shot today Pt has had it in the past without problems Will schedule PE with pap- is due

## 2012-04-18 ENCOUNTER — Ambulatory Visit (INDEPENDENT_AMBULATORY_CARE_PROVIDER_SITE_OTHER): Payer: BC Managed Care – PPO | Admitting: *Deleted

## 2012-04-18 DIAGNOSIS — Z309 Encounter for contraceptive management, unspecified: Secondary | ICD-10-CM

## 2012-04-18 DIAGNOSIS — IMO0001 Reserved for inherently not codable concepts without codable children: Secondary | ICD-10-CM

## 2012-04-18 NOTE — Progress Notes (Signed)
After I called pt from waiting room to the exam room pt stated that she didn't want the depo injection and she wanted to cancel this appt., but wanted Dr. Milinda Antis to look at her foot, I advise pt that this is a nurse visit and she would have to schedule a regular appt with Dr. Milinda Antis. I advise pt that she would have to check with front office to see how to get this appt canceled because I don't handle that. Pt stated that she had already paid her Co-pay so she wasn't going to cancel the appt she just left. Spoke with Hansel Starling and Carlena Sax about how to charge pt, they said advise me that they will check into it and let me know

## 2012-05-02 ENCOUNTER — Other Ambulatory Visit: Payer: Self-pay

## 2012-05-08 ENCOUNTER — Other Ambulatory Visit: Payer: Self-pay

## 2012-05-13 ENCOUNTER — Encounter: Payer: Self-pay | Admitting: Family Medicine

## 2012-05-13 ENCOUNTER — Ambulatory Visit (INDEPENDENT_AMBULATORY_CARE_PROVIDER_SITE_OTHER): Payer: BC Managed Care – PPO | Admitting: Family Medicine

## 2012-05-13 VITALS — BP 116/80 | HR 96 | Temp 98.7°F | Ht 63.0 in | Wt 135.0 lb

## 2012-05-13 DIAGNOSIS — B9689 Other specified bacterial agents as the cause of diseases classified elsewhere: Secondary | ICD-10-CM

## 2012-05-13 DIAGNOSIS — H6692 Otitis media, unspecified, left ear: Secondary | ICD-10-CM

## 2012-05-13 DIAGNOSIS — J019 Acute sinusitis, unspecified: Secondary | ICD-10-CM

## 2012-05-13 DIAGNOSIS — H6690 Otitis media, unspecified, unspecified ear: Secondary | ICD-10-CM

## 2012-05-13 MED ORDER — AMOXICILLIN-POT CLAVULANATE 875-125 MG PO TABS: 1.0000 | ORAL_TABLET | Freq: Two times a day (BID) | ORAL | Status: DC

## 2012-05-13 NOTE — Assessment & Plan Note (Signed)
With sinus pain/ purulent drainage and also L OM  tx with augmentin Disc symptomatic care - see instructions on AVS  Update if not starting to improve in a week or if worsening

## 2012-05-13 NOTE — Progress Notes (Signed)
  Subjective:    Patient ID: Karen Sanchez, female    DOB: 11-15-1985, 27 y.o.   MRN: [id number]  HPI Here with sinus symptoms  Thinks more than a cold  3 weeks of symptoms  Started with sinus pressure and congestion/ runny and stuffy nose/ cough / ST Facial pain  L ear - is plugged/ congested with pain and dec hearing Pain has rad to her teeth as well   No fever  Drainage is green   Cough is not too bad  Is taking -- mucinex day and night/ tried mucinex D also  Nyquil/ day quil  Not a lot of help  No analgesics   Patient Active Problem List  Diagnosis  . HYPOGLYCEMIA  . ANEMIA  . ADJUSTMENT DISORDER WITH ANXIETY  . IRREGULAR MENSTRUATION  . SKIN TAG  . BACK PAIN  . FREQUENCY, URINARY  . SI (sacroiliac) joint dysfunction  . Sinusitis  . Acute sinusitis  . Depot contraception   Past Medical History  Diagnosis Date  . Hypoglycemia    No past surgical history on file. History  Substance Use Topics  . Smoking status: Never Smoker   . Smokeless tobacco: Not on file  . Alcohol Use: No   Family History  Problem Relation Age of Onset  . Diabetes Mother   . Cancer Maternal Aunt     breast  . Cancer Paternal Aunt     breast   No Known Allergies Current Outpatient Prescriptions on File Prior to Visit  Medication Sig Dispense Refill  . traZODone (DESYREL) 50 MG tablet Take 1 tablet by mouth at bedtime as needed.          Review of Systems Review of Systems  Constitutional: Negative for unexpected weight change. pos for fever/ malaise ENT pos for sinus pain/ cong/ ST and ear pain, neg for ear drainage Eyes: Negative for pain and visual disturbance.  Respiratory: Negative for wheeze  and shortness of breath.   Cardiovascular: Negative for cp or palpitations    Gastrointestinal: Negative for nausea, diarrhea and constipation.  Genitourinary: Negative for urgency and frequency.  Skin: Negative for pallor or rash   Neurological: Negative for weakness,  light-headedness, numbness and headaches.  Hematological: Negative for adenopathy. Does not bruise/bleed easily.  Psychiatric/Behavioral: Negative for dysphoric mood. The patient is not nervous/anxious.         Objective:   Physical Exam  Constitutional: She appears well-developed and well-nourished. No distress.  HENT:  Head: Normocephalic and atraumatic.  Right Ear: External ear normal.  Mouth/Throat: Oropharynx is clear and moist. No oropharyngeal exudate.       Nares are injected and congested  bilat ethmoid and maxillary sinus tenderness  L TM is retracted/ dull erythematous   Eyes: Conjunctivae normal and EOM are normal. Pupils are equal, round, and reactive to light. Right eye exhibits no discharge. Left eye exhibits no discharge.  Neck: Normal range of motion. Neck supple.  Cardiovascular: Normal rate and regular rhythm.   Pulmonary/Chest: Effort normal and breath sounds normal. No respiratory distress. She has no wheezes. She has no rales.  Lymphadenopathy:    She has no cervical adenopathy.  Neurological: She is alert.  Skin: Skin is warm and dry. No rash noted.  Psychiatric: She has a normal mood and affect.          Assessment & Plan:

## 2012-05-13 NOTE — Assessment & Plan Note (Signed)
Significant with sinusitis tx with augmentin Suggested ibuprofen for pain  Update if not starting to improve in a week or if worsening

## 2012-05-13 NOTE — Patient Instructions (Addendum)
You have a sinus and ear infection Take augmentin as directed  Continue mucinex product  Also try some ibuprofen as directed with food  Saline nasal spray can also help along with warm compresses on face Update if not starting to improve in a week or if worsening

## 2012-05-24 ENCOUNTER — Other Ambulatory Visit: Payer: Self-pay

## 2012-05-27 ENCOUNTER — Telehealth: Payer: Self-pay

## 2012-05-27 NOTE — Telephone Encounter (Signed)
I would love to re check her ear if possible - she may just have residual sinus congestion that will take a while to resolve but if her ear is still red - we may need to extend abx  Please make f/u - let me know if no openings, could put her in at 4:15 today or tomorrow, thanks

## 2012-05-27 NOTE — Telephone Encounter (Signed)
Pt seen on 05/13/12; pt has finished antibiotic; ear is better but still popping on and off (like changing altitude). Pt still has decreased hearing and sinus pressure and congestion. Pt has no pain, fever, cough or S/T now. Pt did not know if should get more med or should be rechecked.[facility] [address]Please advise.

## 2012-05-27 NOTE — Telephone Encounter (Signed)
Pt scheduled appt for tomorrow @ 4:15

## 2012-05-28 ENCOUNTER — Encounter: Payer: Self-pay | Admitting: Family Medicine

## 2012-05-28 ENCOUNTER — Ambulatory Visit (INDEPENDENT_AMBULATORY_CARE_PROVIDER_SITE_OTHER): Payer: Federal, State, Local not specified - PPO | Admitting: Family Medicine

## 2012-05-28 VITALS — BP 106/76 | HR 78 | Temp 98.4°F | Ht 63.0 in | Wt 135.8 lb

## 2012-05-28 DIAGNOSIS — H6980 Other specified disorders of Eustachian tube, unspecified ear: Secondary | ICD-10-CM

## 2012-05-28 MED ORDER — FLUTICASONE PROPIONATE 50 MCG/ACT NA SUSP: 2.0000 | Freq: Every day | NASAL | Status: DC

## 2012-05-28 NOTE — Patient Instructions (Addendum)
Start flonase for sinus and ear congestion If pain or fever let me know  mucinex D is fine if you need it  In the spring - an antihistamine like claritin/ allegra/ zyrtec are ok for runny nose as well

## 2012-05-28 NOTE — Progress Notes (Signed)
  Subjective:    Patient ID: Karen Sanchez, female    DOB: 09-27-1985, 27 y.o.   MRN: [id number]  HPI Here for f/u of OM  Took augmentin 10 days  Ear feels full, hearing is still not 100% Pops when she sneezes or swallows  No longer any pain   Sinuses still congested  Was taking mucinex D   Patient Active Problem List  Diagnosis  . HYPOGLYCEMIA  . ANEMIA  . ADJUSTMENT DISORDER WITH ANXIETY  . IRREGULAR MENSTRUATION  . SKIN TAG  . BACK PAIN  . FREQUENCY, URINARY  . SI (sacroiliac) joint dysfunction  . Depot contraception  . Acute bacterial sinusitis  . Left otitis media   Past Medical History  Diagnosis Date  . Hypoglycemia    No past surgical history on file. History  Substance Use Topics  . Smoking status: Never Smoker   . Smokeless tobacco: Not on file  . Alcohol Use: No   Family History  Problem Relation Age of Onset  . Diabetes Mother   . Cancer Maternal Aunt     breast  . Cancer Paternal Aunt     breast   No Known Allergies Current Outpatient Prescriptions on File Prior to Visit  Medication Sig Dispense Refill  . sertraline (ZOLOFT) 100 MG tablet Take 100 mg by mouth daily.      . traZODone (DESYREL) 50 MG tablet Take 1 tablet by mouth at bedtime as needed.       No current facility-administered medications on file prior to visit.      Review of Systems Review of Systems  Constitutional: Negative for fever, appetite change, fatigue and unexpected weight change.  ENT pos for some sinus congestion and drip/ neg for st or sinus pain  Eyes: Negative for pain and visual disturbance.  Respiratory: Negative for cough and shortness of breath.   Cardiovascular: Negative for cp or palpitations    Gastrointestinal: Negative for nausea, diarrhea and constipation.  Genitourinary: Negative for urgency and frequency.  Skin: Negative for pallor or rash   Neurological: Negative for weakness, light-headedness, numbness and headaches.  Hematological: Negative  for adenopathy. Does not bruise/bleed easily.  Psychiatric/Behavioral: Negative for dysphoric mood. The patient is not nervous/anxious.         Objective:   Physical Exam  Constitutional: She appears well-developed and well-nourished. No distress.  HENT:  Head: Normocephalic and atraumatic.  Right Ear: External ear normal.  Left Ear: External ear normal.  Mouth/Throat: Oropharynx is clear and moist. No oropharyngeal exudate.  Nares are boggy but clear Complete resolution of OM on L  TMs are slt retracted   Eyes: Conjunctivae and EOM are normal. Pupils are equal, round, and reactive to light. Right eye exhibits no discharge. Left eye exhibits no discharge.  Neck: Normal range of motion. Neck supple.  Cardiovascular: Normal rate and regular rhythm.   Pulmonary/Chest: Effort normal and breath sounds normal.  Lymphadenopathy:    She has no cervical adenopathy.  Skin: Skin is warm and dry. No rash noted.  Psychiatric: She has a normal mood and affect.          Assessment & Plan:

## 2012-05-29 NOTE — Assessment & Plan Note (Signed)
With resolution of sinusitis/ uri and OM L Will tx with flonase Antihistamine otc in addition in allergy season  Update if not starting to improve in a week or if worsening

## 2012-06-25 ENCOUNTER — Encounter: Payer: BC Managed Care – PPO | Admitting: Family Medicine

## 2012-10-31 ENCOUNTER — Encounter: Payer: Federal, State, Local not specified - PPO | Admitting: Family Medicine

## 2013-01-07 ENCOUNTER — Other Ambulatory Visit: Payer: Self-pay

## 2013-02-12 ENCOUNTER — Other Ambulatory Visit: Payer: Self-pay

## 2013-03-11 ENCOUNTER — Ambulatory Visit (INDEPENDENT_AMBULATORY_CARE_PROVIDER_SITE_OTHER): Payer: Federal, State, Local not specified - PPO | Admitting: Family Medicine

## 2013-03-11 ENCOUNTER — Encounter: Payer: Self-pay | Admitting: *Deleted

## 2013-03-11 ENCOUNTER — Other Ambulatory Visit (HOSPITAL_COMMUNITY)
Admission: RE | Admit: 2013-03-11 | Discharge: 2013-03-11 | Disposition: A | Discharge status: Home / Self Care | Payer: Federal, State, Local not specified - PPO | Source: Ambulatory Visit | Attending: Family Medicine | Admitting: Family Medicine

## 2013-03-11 ENCOUNTER — Encounter: Payer: Self-pay | Admitting: Family Medicine

## 2013-03-11 VITALS — BP 94/76 | HR 86 | Temp 98.2°F | Ht 62.56 in | Wt 147.8 lb

## 2013-03-11 DIAGNOSIS — Z209 Contact with and (suspected) exposure to unspecified communicable disease: Secondary | ICD-10-CM

## 2013-03-11 DIAGNOSIS — Z0000 Encounter for general adult medical examination without abnormal findings: Secondary | ICD-10-CM

## 2013-03-11 DIAGNOSIS — Z01419 Encounter for gynecological examination (general) (routine) without abnormal findings: Secondary | ICD-10-CM | POA: Insufficient documentation

## 2013-03-11 NOTE — Progress Notes (Signed)
Subjective:    Patient ID: Karen Sanchez, female    DOB: 30-Jul-1985, 27 y.o.   MRN: [id number]  HPI Here for health maintenance exam and to review chronic medical problems    Having some post nasal drainage and congestion - and mucinex does help  Itches in her head - thinks it is allergies She used to take claritin- needs to take it more often  Steroid nasal sprays give her nosebleeds   Wt is up 12 lb with bmi of 26  Flu vaccine - declines flu shots   Pap 10/11-has not been to gyn in a long time  No plans to get pregnant  husb had a vasectomy  Periods are monthly and fairly regular - not too long and not too heavy  She gets by pretty well   Mood - with current medicines - is improved - just started pristiq back for her  She is happy with that and trazadone  Dr Delle Reining is her psychiatrist   Td 12/07  Needs labs - will do those today  She is interested in screening for hepatitis C (her father has it) - he has had it lifelong and has cirrhosis  She has no symptoms -never had jaundice   Eats too much - and has gained weight  Is getting regular exercise   Patient Active Problem List   Diagnosis Date Noted  . Routine general medical examination at a health care facility 03/11/2013  . ETD (eustachian tube dysfunction) 05/28/2012  . Depot contraception 02/01/2012  . SI (sacroiliac) joint dysfunction 11/30/2010  . 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 SKIN TAG 08/26/2008  . BACK PAIN 07/10/2007  . IRREGULAR MENSTRUATION 01/17/2007  . FREQUENCY, URINARY 01/17/2007  . HYPOGLYCEMIA 07/12/2006  . ANEMIA 07/12/2006   Past Medical History  Diagnosis Date  . Hypoglycemia    No past surgical history on file. History  Substance Use Topics  . Smoking status: Never Smoker   . Smokeless tobacco: Never Used  . Alcohol Use: No   Family History  Problem Relation Age of Onset  . Diabetes Mother   . Cancer Maternal Aunt     breast  . Cancer Paternal Aunt     breast    No Known Allergies Current Outpatient Prescriptions on File Prior to Visit  Medication Sig Dispense Refill  . traZODone (DESYREL) 50 MG tablet Take 1 tablet by mouth at bedtime as needed.       No current facility-administered medications on file prior to visit.      Review of Systems Review of Systems  Constitutional: Negative for fever, appetite change, fatigue and unexpected weight change.  Eyes: Negative for pain and visual disturbance.  Respiratory: Negative for cough and shortness of breath.   Cardiovascular: Negative for cp or palpitations    Gastrointestinal: Negative for nausea, diarrhea and constipation. neg for abd pain or jaundice  Genitourinary: Negative for urgency and frequency. neg for abn menses  Skin: Negative for pallor or rash   Neurological: Negative for weakness, light-headedness, numbness and headaches.  Hematological: Negative for adenopathy. Does not bruise/bleed easily.  Psychiatric/Behavioral: pos for dep/anx that are controlled and tx by psychiatrist .         Objective:   Physical Exam  Constitutional: She appears well-developed and well-nourished. No distress.  HENT:  Head: Normocephalic and atraumatic.  Right Ear: External ear normal.  Left Ear: External ear normal.  Nose: Nose normal.  Mouth/Throat: Oropharynx is clear and moist.  Eyes:  Conjunctivae and EOM are normal. Pupils are equal, round, and reactive to light. Right eye exhibits no discharge. Left eye exhibits no discharge. No scleral icterus.  Neck: Normal range of motion. Neck supple. No JVD present. No thyromegaly present.  Cardiovascular: Normal rate, regular rhythm, normal heart sounds and intact distal pulses.  Exam reveals no gallop.   Pulmonary/Chest: Effort normal and breath sounds normal. No respiratory distress. She has no wheezes. She has no rales.  Abdominal: Soft. Bowel sounds are normal. She exhibits no distension and no mass. There is no tenderness.  Genitourinary: Vagina  normal and uterus normal. No breast swelling, tenderness, discharge or bleeding. There is no rash, tenderness or lesion on the right labia. There is no rash, tenderness or lesion on the left labia. Uterus is not enlarged and not tender. Cervix exhibits no motion tenderness, no discharge and no friability. Right adnexum displays no mass, no tenderness and no fullness. Left adnexum displays no mass, no tenderness and no fullness. No bleeding around the vagina. No vaginal discharge found.  Breast exam: No mass, nodules, thickening, tenderness, bulging, retraction, inflamation, nipple discharge or skin changes noted.  No axillary or clavicular LA.  Chaperoned exam.    Musculoskeletal: She exhibits no edema and no tenderness.  Lymphadenopathy:    She has no cervical adenopathy.  Neurological: She is alert. She has normal reflexes. No cranial nerve deficit. She exhibits normal muscle tone. Coordination normal.  Skin: Skin is warm and dry. No rash noted. No erythema. No pallor.  Psychiatric: She has a normal mood and affect.          Assessment & Plan:

## 2013-03-11 NOTE — Patient Instructions (Signed)
Lab today  Try claritin or zyrtec otc - for allergy/sinus symptoms  Stay active and try to eat a healthy diet  Pap done today

## 2013-03-11 NOTE — Telephone Encounter (Signed)
This encounter was created in error - please disregard.

## 2013-03-11 NOTE — Progress Notes (Signed)
Pre-visit discussion using our clinic review tool. No additional management support is needed unless otherwise documented below in the visit note.  

## 2013-03-12 LAB — CBC WITH DIFFERENTIAL/PLATELET
Basophils Absolute: 0.1 10*3/uL (ref 0.0–0.1)
Basophils Relative: 1.2 % (ref 0.0–3.0)
Eosinophils Relative: 1.3 % (ref 0.0–5.0)
HCT: 39.6 % (ref 36.0–46.0)
Hemoglobin: 13.3 g/dL (ref 12.0–15.0)
Lymphs Abs: 1.8 10*3/uL (ref 0.7–4.0)
Monocytes Relative: 11.8 % (ref 3.0–12.0)
Neutro Abs: 4.1 10*3/uL (ref 1.4–7.7)
RBC: 4.63 Mil/uL (ref 3.87–5.11)
RDW: 12.9 % (ref 11.5–14.6)

## 2013-03-12 LAB — LIPID PANEL
Cholesterol: 212 mg/dL — ABNORMAL HIGH (ref 0–200)
HDL: 60.7 mg/dL (ref >39.00)
Triglycerides: 91 mg/dL (ref 0.0–149.0)

## 2013-03-12 LAB — COMPREHENSIVE METABOLIC PANEL
BUN: 9 mg/dL (ref 6–23)
CO2: 29 mEq/L (ref 19–32)
Calcium: 9.2 mg/dL (ref 8.4–10.5)
Creatinine, Ser: 0.8 mg/dL (ref 0.4–1.2)
GFR: 97.61 mL/min (ref >60.00)
Glucose, Bld: 84 mg/dL (ref 70–99)
Total Bilirubin: 0.6 mg/dL (ref 0.3–1.2)

## 2013-03-12 LAB — LDL CHOLESTEROL, DIRECT: Direct LDL: 139.2 mg/dL

## 2013-03-12 LAB — TSH: TSH: 1.58 u[IU]/mL (ref 0.35–5.50)

## 2013-03-12 NOTE — Assessment & Plan Note (Signed)
Reviewed health habits including diet and exercise and skin cancer prevention Reviewed appropriate screening tests for age  Also reviewed health mt list, fam hx and immunization status , as well as social and family history   See hpi Lab today

## 2013-03-12 NOTE — Assessment & Plan Note (Signed)
Exam with pap  No problems or complaints

## 2013-03-12 NOTE — Assessment & Plan Note (Signed)
Father has hep C Pt wants to be screened for hepatitis No symptoms

## 2013-03-13 LAB — HEPATITIS PANEL, ACUTE: Hep A IgM: NONREACTIVE

## 2013-05-13 ENCOUNTER — Ambulatory Visit (INDEPENDENT_AMBULATORY_CARE_PROVIDER_SITE_OTHER): Payer: Federal, State, Local not specified - PPO | Admitting: Family Medicine

## 2013-05-13 ENCOUNTER — Encounter: Payer: Self-pay | Admitting: Family Medicine

## 2013-05-13 VITALS — BP 80/52 | HR 67 | Temp 97.8°F | Ht 62.5 in | Wt 147.2 lb

## 2013-05-13 DIAGNOSIS — R110 Nausea: Secondary | ICD-10-CM | POA: Insufficient documentation

## 2013-05-13 LAB — BASIC METABOLIC PANEL
BUN: 10 mg/dL (ref 6–23)
CHLORIDE: 103 meq/L (ref 96–112)
CO2: 29 mEq/L (ref 19–32)
Calcium: 9 mg/dL (ref 8.4–10.5)
Creatinine, Ser: 0.8 mg/dL (ref 0.4–1.2)
GFR: 91.82 mL/min (ref >60.00)
Glucose, Bld: 60 mg/dL — ABNORMAL LOW (ref 70–99)
POTASSIUM: 3.6 meq/L (ref 3.5–5.1)
Sodium: 139 mEq/L (ref 135–145)

## 2013-05-13 LAB — CBC WITH DIFFERENTIAL/PLATELET
BASOS ABS: 0 10*3/uL (ref 0.0–0.1)
BASOS PCT: 0.2 % (ref 0.0–3.0)
EOS ABS: 0.1 10*3/uL (ref 0.0–0.7)
Eosinophils Relative: 1.2 % (ref 0.0–5.0)
HCT: 40.7 % (ref 36.0–46.0)
Hemoglobin: 13.3 g/dL (ref 12.0–15.0)
Lymphocytes Relative: 28.1 % (ref 12.0–46.0)
Lymphs Abs: 1.5 10*3/uL (ref 0.7–4.0)
MCHC: 32.7 g/dL (ref 30.0–36.0)
MCV: 87.2 fl (ref 78.0–100.0)
MONO ABS: 0.7 10*3/uL (ref 0.1–1.0)
Monocytes Relative: 13.2 % — ABNORMAL HIGH (ref 3.0–12.0)
NEUTROS PCT: 57.3 % (ref 43.0–77.0)
Neutro Abs: 3 10*3/uL (ref 1.4–7.7)
PLATELETS: 287 10*3/uL (ref 150.0–400.0)
RBC: 4.67 Mil/uL (ref 3.87–5.11)
RDW: 13 % (ref 11.5–14.6)
WBC: 5.2 10*3/uL (ref 4.5–10.5)

## 2013-05-13 LAB — HEPATIC FUNCTION PANEL
ALBUMIN: 3.9 g/dL (ref 3.5–5.2)
ALT: 11 U/L (ref 0–35)
AST: 13 U/L (ref 0–37)
Alkaline Phosphatase: 75 U/L (ref 39–117)
BILIRUBIN DIRECT: 0 mg/dL (ref 0.0–0.3)
TOTAL PROTEIN: 6.9 g/dL (ref 6.0–8.3)
Total Bilirubin: 0.8 mg/dL (ref 0.3–1.2)

## 2013-05-13 LAB — LIPASE: LIPASE: 21 U/L (ref 11.0–59.0)

## 2013-05-13 LAB — AMYLASE: AMYLASE: 56 U/L (ref 27–131)

## 2013-05-13 MED ORDER — OMEPRAZOLE 20 MG PO CPDR: 20.0000 mg | DELAYED_RELEASE_CAPSULE | Freq: Every day | ORAL | Status: DC

## 2013-05-13 NOTE — Progress Notes (Signed)
Subjective:    Patient ID: Karen Sanchez, female    DOB: 1986/01/27, 28 y.o.   MRN: [id number]  HPI Here for nausea  Has had it for years -but lately much worse   ? Never found out a cause   Lately her diet has been a bit better - has decreased junk food and soda  In general salty foods tend to help nausea a bit  No pain or burning or heartburn  No change with medicines  No stool changes at all  She often wakes up with it  4-5 times per week 2 hours to all day  She does dry heave   She tries to avoid nsaids    No wt change   Does not think mood or stress make a difference   No chance pregnant  Just started menses and not sexually active   Patient Active Problem List   Diagnosis Date Noted  . Nausea alone 05/13/2013  . Routine general medical examination at a health care facility 03/11/2013  . Encounter for routine gynecological examination 03/11/2013  . Exposure to communicable disease 03/11/2013  . 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 BACK PAIN 07/10/2007  . HYPOGLYCEMIA 07/12/2006  . ANEMIA 07/12/2006   Past Medical History  Diagnosis Date  . Hypoglycemia    No past surgical history on file. History  Substance Use Topics  . Smoking status: Never Smoker   . Smokeless tobacco: Never Used  . Alcohol Use: No   Family History  Problem Relation Age of Onset  . Diabetes Mother   . Cancer Maternal Aunt     breast  . Cancer Paternal Aunt     breast   No Known Allergies Current Outpatient Prescriptions on File Prior to Visit  Medication Sig Dispense Refill  . desvenlafaxine (PRISTIQ) 50 MG 24 hr tablet Take 50 mg by mouth daily.      . traZODone (DESYREL) 50 MG tablet Take 1 tablet by mouth at bedtime as needed.       No current facility-administered medications on file prior to visit.    Review of Systems Review of Systems  Constitutional: Negative for fever, appetite change, fatigue and unexpected weight change.  Eyes: Negative for pain  and visual disturbance.  Respiratory: Negative for cough and shortness of breath.   Cardiovascular: Negative for cp or palpitations    Gastrointestinal: Negative for , diarrhea and constipation. neg for blood in stool or dark stool or abd pain  Genitourinary: Negative for urgency and frequency.  Skin: Negative for pallor or rash   Neurological: Negative for weakness, light-headedness, numbness and headaches.  Hematological: Negative for adenopathy. Does not bruise/bleed easily.  Psychiatric/Behavioral: Negative for dysphoric mood. The patient is not nervous/anxious.         Objective:   Physical Exam  Constitutional: She appears well-developed and well-nourished. No distress.  HENT:  Head: Normocephalic and atraumatic.  Right Ear: External ear normal.  Left Ear: External ear normal.  Nose: Nose normal.  Mouth/Throat: Oropharynx is clear and moist.  Eyes: Conjunctivae and EOM are normal. Pupils are equal, round, and reactive to light. Right eye exhibits no discharge. Left eye exhibits no discharge. No scleral icterus.  Neck: Normal range of motion. Neck supple. No JVD present. No thyromegaly present.  Cardiovascular: Normal rate, regular rhythm, normal heart sounds and intact distal pulses.  Exam reveals no gallop.   Pulmonary/Chest: Effort normal and breath sounds normal. No respiratory distress. She has no wheezes. She has  no rales.  Abdominal: Soft. Bowel sounds are normal. She exhibits no distension and no mass. There is no hepatosplenomegaly. There is tenderness in the epigastric area. There is no rigidity, no rebound, no guarding, no CVA tenderness, no tenderness at McBurney's point and negative Murphy's sign.  Musculoskeletal: She exhibits no edema and no tenderness.  Lymphadenopathy:    She has no cervical adenopathy.  Neurological: She is alert. She has normal reflexes. No cranial nerve deficit. She exhibits normal muscle tone. Coordination normal.  Skin: Skin is warm and dry.  No rash noted. No erythema. No pallor.  Psychiatric: She has a normal mood and affect.          Assessment & Plan:

## 2013-05-13 NOTE — Progress Notes (Signed)
Pre-visit discussion using our clinic review tool. No additional management support is needed unless otherwise documented below in the visit note.  

## 2013-05-13 NOTE — Patient Instructions (Signed)
Take omeprazole one pill daily in am (first dose today when you get it )  Avoid acidic and spicy foods and coffee  Labs today  We will go from there based on results

## 2013-05-14 NOTE — Assessment & Plan Note (Signed)
Going on for years and worse lately  ? If pos gastritis tx with omeprazole Labs today  Will make plan based on results and resp to ppi

## 2013-06-16 ENCOUNTER — Encounter: Payer: Self-pay | Admitting: Family Medicine

## 2013-06-17 ENCOUNTER — Telehealth: Payer: Self-pay | Admitting: Family Medicine

## 2013-06-17 DIAGNOSIS — R110 Nausea: Secondary | ICD-10-CM

## 2013-06-17 NOTE — Telephone Encounter (Signed)
Ref to GI for nausea- please see mychart encounter

## 2013-06-25 ENCOUNTER — Encounter: Payer: Self-pay | Admitting: Gastroenterology

## 2013-06-30 ENCOUNTER — Ambulatory Visit (INDEPENDENT_AMBULATORY_CARE_PROVIDER_SITE_OTHER): Payer: Federal, State, Local not specified - PPO | Admitting: Family Medicine

## 2013-06-30 ENCOUNTER — Ambulatory Visit: Payer: Federal, State, Local not specified - PPO | Admitting: Family Medicine

## 2013-06-30 ENCOUNTER — Encounter: Payer: Self-pay | Admitting: Family Medicine

## 2013-06-30 VITALS — BP 102/64 | HR 63 | Temp 98.6°F | Ht 62.5 in | Wt 142.5 lb

## 2013-06-30 DIAGNOSIS — A088 Other specified intestinal infections: Secondary | ICD-10-CM

## 2013-06-30 DIAGNOSIS — A084 Viral intestinal infection, unspecified: Secondary | ICD-10-CM | POA: Insufficient documentation

## 2013-06-30 MED ORDER — PROMETHAZINE HCL 25 MG PO TABS: 25.0000 mg | ORAL_TABLET | Freq: Three times a day (TID) | ORAL | Status: DC | PRN

## 2013-06-30 NOTE — Progress Notes (Signed)
Subjective:    Patient ID: Karen Sanchez, female    DOB: 20-May-1985, 28 y.o.   MRN: [id number]  HPI Here with diarrhea and nausea and body aches  Day 8   Started with vomiting - 1 day - dry heaves and nausea since  Diarrhea continues - slowed down a bit - she has urgency to have bm if she eats anything  No blood in stool Is taking sips of fluids  Has about 6 bms per day   Had returned from a cruise before this started  No one else in her party got sick   No fever - very achey but that is improved   abd is sore / but not painful   Her baseline nausea is the same -has GI apt in may  Patient Active Problem List   Diagnosis Date Noted  . Nausea alone 05/13/2013  . Routine general medical examination at a health care facility 03/11/2013  . Encounter for routine gynecological examination 03/11/2013  . Exposure to communicable disease 03/11/2013  . 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 BACK PAIN 07/10/2007  . HYPOGLYCEMIA 07/12/2006  . ANEMIA 07/12/2006   Past Medical History  Diagnosis Date  . Hypoglycemia    No past surgical history on file. History  Substance Use Topics  . Smoking status: Never Smoker   . Smokeless tobacco: Never Used  . Alcohol Use: No   Family History  Problem Relation Age of Onset  . Diabetes Mother   . Cancer Maternal Aunt     breast  . Cancer Paternal Aunt     breast   No Known Allergies Current Outpatient Prescriptions on File Prior to Visit  Medication Sig Dispense Refill  . desvenlafaxine (PRISTIQ) 50 MG 24 hr tablet Take 50 mg by mouth daily.      . traZODone (DESYREL) 50 MG tablet Take 1 tablet by mouth at bedtime as needed.       No current facility-administered medications on file prior to visit.      Review of Systems Review of Systems  Constitutional: Negative for fever, appetite change, fatigue and unexpected weight change.  Eyes: Negative for pain and visual disturbance.  Respiratory: Negative for cough and  shortness of breath.   Cardiovascular: Negative for cp or palpitations    Gastrointestinal: Negative for abd pain/ constipation/blood in stool or dark stool  Genitourinary: Negative for urgency and frequency. neg for hematuria  Skin: Negative for pallor or rash   Neurological: Negative for weakness, light-headedness, numbness and headaches.  Hematological: Negative for adenopathy. Does not bruise/bleed easily.  Psychiatric/Behavioral: Negative for dysphoric mood. The patient is not nervous/anxious.         Objective:   Physical Exam  Constitutional: She appears well-developed and well-nourished. No distress.  Appears fatigued   HENT:  Head: Normocephalic and atraumatic.  Mouth/Throat: Oropharynx is clear and moist.  Eyes: Conjunctivae and EOM are normal. Pupils are equal, round, and reactive to light. No scleral icterus.  Neck: Normal range of motion. Neck supple.  Cardiovascular: Normal rate and regular rhythm.   Pulmonary/Chest: Effort normal and breath sounds normal. No respiratory distress. She has no wheezes. She has no rales.  Abdominal: Soft. She exhibits no distension and no mass. There is no tenderness. There is no rebound and no guarding.  Mildly hyperactive bs -not high pitched or tinkling  Musculoskeletal: She exhibits no edema.  Lymphadenopathy:    She has no cervical adenopathy.  Neurological: She is alert. She has  normal reflexes.  Skin: Skin is warm and dry. No pallor.  Brisk capillary refill  Psychiatric: She has a normal mood and affect.          Assessment & Plan:

## 2013-06-30 NOTE — Progress Notes (Signed)
Pre visit review using our clinic review tool, if applicable. No additional management support is needed unless otherwise documented below in the visit note. 

## 2013-06-30 NOTE — Patient Instructions (Signed)
Take phenergan as needed for nausea - and watch out for sedation  immodium as needed for diarrhea over the counter- if worse or abdominal pain please let me know  If no further improvement in 3-4 days please let me know  If new symptoms - like fever - let me know

## 2013-07-02 NOTE — Assessment & Plan Note (Signed)
Reassuring exam Expect further imp in the upcoming days  Disc fluid rehydration followed by BRAT diet when ready- and adv slowly  Rest  Update if not starting to improve in a week or if worsening  -esp if abd pain or fever develop Given phenergan for nausea -warning of sedation

## 2013-07-08 ENCOUNTER — Encounter: Payer: Self-pay | Admitting: Family Medicine

## 2013-08-10 ENCOUNTER — Encounter: Payer: Self-pay | Admitting: Gastroenterology

## 2013-08-10 ENCOUNTER — Ambulatory Visit (INDEPENDENT_AMBULATORY_CARE_PROVIDER_SITE_OTHER): Payer: Federal, State, Local not specified - PPO | Admitting: Gastroenterology

## 2013-08-10 ENCOUNTER — Other Ambulatory Visit: Payer: Federal, State, Local not specified - PPO

## 2013-08-10 ENCOUNTER — Other Ambulatory Visit: Payer: Self-pay | Admitting: Gastroenterology

## 2013-08-10 VITALS — BP 108/70 | HR 98 | Ht 62.5 in | Wt 149.0 lb

## 2013-08-10 DIAGNOSIS — G8929 Other chronic pain: Secondary | ICD-10-CM

## 2013-08-10 DIAGNOSIS — R51 Headache: Principal | ICD-10-CM

## 2013-08-10 DIAGNOSIS — R1013 Epigastric pain: Secondary | ICD-10-CM

## 2013-08-10 DIAGNOSIS — R112 Nausea with vomiting, unspecified: Secondary | ICD-10-CM

## 2013-08-10 NOTE — Patient Instructions (Signed)
Start Dexilant samples one tablet by mouth once daily x 2 weeks.  Your physician has requested that you go to the basement for the following lab work before leaving today:Celiac panel.  You have been scheduled for an abdominal ultrasound at [facility] (1st floor of hospital) on 08/13/13 at 8:30am. Please arrive 15 minutes prior to your appointment for registration. Make certain not to have anything to eat or drink 6 hours prior to your appointment. Should you need to reschedule your appointment, please contact radiology at [phone]. This test typically takes about 30 minutes to perform.  You have been scheduled for an endoscopy with propofol. Please follow written instructions given to you at your visit today. If you use inhalers (even only as needed), please bring them with you on the day of your procedure.  Thank you for choosing me and [facility].  Pricilla Riffle. Dagoberto Ligas., MD., Mar[facility] Regal

## 2013-08-10 NOTE — Progress Notes (Signed)
    History of Present Illness: This is a 28 year old female who relates recurrent problems with nausea and vomiting since age 23. She states she was evaluated by a gastroenterologist at age 42 either in [place] or [place] but she doesn't recall. She states she had a series of tests at that time and her symptoms were felt to be due to stress. She has episodes of nausea and vomiting, generally dry heaves, and the symptoms have worsened over the past 1-2 years. Her symptoms now occur about 4 or 5 days per week. She occasionally has epigastric pain associated with her symptoms but usually there is no pain. Her symptoms are not related to meals or time of day. She states that tried omeprazole for 2 weeks without a change in symptoms. She noted that resting will relieve her symptoms. In addition she complains of frequent headaches. Recent blood work was unremarkable. Denies weight loss, constipation, diarrhea, change in stool caliber, melena, hematochezia, dysphagia, reflux symptoms, chest pain.  Review of Systems: Pertinent positive and negative review of systems were noted in the above HPI section. All other review of systems were otherwise negative.  Current Medications, Allergies, Past Medical History, Past Surgical History, Family History and Social History were reviewed in [employer] record.  Physical Exam: General: Well developed , well nourished, no acute distress Head: Normocephalic and atraumatic Eyes:  sclerae anicteric, EOMI Ears: Normal auditory acuity Mouth: No deformity or lesions Neck: Supple, no masses or thyromegaly Lungs: Clear throughout to auscultation Heart: Regular rate and rhythm; no murmurs, rubs or bruits Abdomen: Soft, non tender and non distended. No masses, hepatosplenomegaly or hernias noted. Normal Bowel sounds Musculoskeletal: Symmetrical with no gross deformities  Skin: No lesions on visible extremities Pulses:  Normal pulses  noted Extremities: No clubbing, cyanosis, edema or deformities noted Neurological: Alert oriented x 4, grossly nonfocal Cervical Nodes:  No significant cervical adenopathy Inguinal Nodes: No significant inguinal adenopathy Psychological:  Alert and cooperative. Normal mood and affect  Assessment and Recommendations:  1. Recurrent nausea, vomiting, headaches with occasional epigastric pain. Neurology referral. Nausea and vomiting could be related to headaches. Rule out GERD, ulcer, cholelithiasis. Possible cyclic vomiting syndrome. Trial of dexlansoprazole 60 mg daily. Schedule upper endoscopy. The risks, benefits, and alternatives to endoscopy with possible biopsy and possible dilation were discussed with the patient and they consent to proceed.

## 2013-08-11 LAB — CELIAC PANEL 10
ENDOMYSIAL SCREEN: NEGATIVE
Gliadin IgA: 8.4 U/mL (ref <20)
Gliadin IgG: 14.3 U/mL (ref <20)
IgA: 174 mg/dL (ref 69–380)
TISSUE TRANSGLUTAMINASE AB, IGA: 3.3 U/mL (ref <20)
Tissue Transglut Ab: 6.3 U/mL (ref <20)

## 2013-08-13 ENCOUNTER — Ambulatory Visit (HOSPITAL_COMMUNITY)
Admission: RE | Admit: 2013-08-13 | Discharge: 2013-08-13 | Disposition: A | Discharge status: Home / Self Care | Payer: Federal, State, Local not specified - PPO | Source: Ambulatory Visit | Attending: Gastroenterology | Admitting: Gastroenterology

## 2013-08-13 DIAGNOSIS — R1013 Epigastric pain: Secondary | ICD-10-CM

## 2013-08-13 DIAGNOSIS — R112 Nausea with vomiting, unspecified: Secondary | ICD-10-CM

## 2013-08-20 ENCOUNTER — Encounter: Payer: Self-pay | Admitting: Gastroenterology

## 2013-08-20 ENCOUNTER — Ambulatory Visit (AMBULATORY_SURGERY_CENTER): Payer: Federal, State, Local not specified - PPO | Admitting: Gastroenterology

## 2013-08-20 VITALS — BP 111/75 | HR 58 | Temp 96.7°F | Resp 18 | Ht 62.5 in | Wt 149.0 lb

## 2013-08-20 DIAGNOSIS — K209 Esophagitis, unspecified without bleeding: Secondary | ICD-10-CM

## 2013-08-20 DIAGNOSIS — R1013 Epigastric pain: Secondary | ICD-10-CM

## 2013-08-20 DIAGNOSIS — D131 Benign neoplasm of stomach: Secondary | ICD-10-CM

## 2013-08-20 DIAGNOSIS — R112 Nausea with vomiting, unspecified: Secondary | ICD-10-CM

## 2013-08-20 MED ORDER — OMEPRAZOLE 40 MG PO CPDR: 40.0000 mg | DELAYED_RELEASE_CAPSULE | Freq: Two times a day (BID) | ORAL | Status: DC

## 2013-08-20 MED ORDER — SODIUM CHLORIDE 0.9 % IV SOLN: 500.0000 mL | INTRAVENOUS | Status: DC

## 2013-08-20 NOTE — Patient Instructions (Signed)
YOU HAD AN ENDOSCOPIC PROCEDURE TODAY AT THE [facility]: Refer to the procedure report that was given to you for any specific questions about what was found during the examination.  If the procedure report does not answer your questions, please call your gastroenterologist to clarify.  If you requested that your care partner not be given the details of your procedure findings, then the procedure report has been included in a sealed envelope for you to review at your convenience later.  YOU SHOULD EXPECT: Some feelings of bloating in the abdomen. Passage of more gas than usual.  Walking can help get rid of the air that was put into your GI tract during the procedure and reduce the bloating. If you had a lower endoscopy (such as a colonoscopy or flexible sigmoidoscopy) you may notice spotting of blood in your stool or on the toilet paper. If you underwent a bowel prep for your procedure, then you may not have a normal bowel movement for a few days.  DIET: Your first meal following the procedure should be a light meal and then it is ok to progress to your normal diet.  A half-sandwich or bowl of soup is an example of a good first meal.  Heavy or fried foods are harder to digest and may make you feel nauseous or bloated.  Likewise meals heavy in dairy and vegetables can cause extra gas to form and this can also increase the bloating.  Drink plenty of fluids but you should avoid alcoholic beverages for 24 hours.  ACTIVITY: Your care partner should take you home directly after the procedure.  You should plan to take it easy, moving slowly for the rest of the day.  You can resume normal activity the day after the procedure however you should NOT DRIVE or use heavy machinery for 24 hours (because of the sedation medicines used during the test).    SYMPTOMS TO REPORT IMMEDIATELY: A gastroenterologist can be reached at any hour.  During normal business hours, 8:30 AM to 5:00 PM Monday through Friday,  call [phone].  After hours and on weekends, please call the GI answering service at [phone] who will take a message and have the physician on call contact you.  Following upper endoscopy (EGD)  Vomiting of blood or coffee ground material  New chest pain or pain under the shoulder blades  Painful or persistently difficult swallowing  New shortness of breath  Fever of 100F or higher  Black, tarry-looking stools  FOLLOW UP: If any biopsies were taken you will be contacted by phone or by letter within the next 1-3 weeks.  Call your gastroenterologist if you have not heard about the biopsies in 3 weeks.  Our staff will call the home number listed on your records the next business day following your procedure to check on you and address any questions or concerns that you may have at that time regarding the information given to you following your procedure. This is a courtesy call and so if there is no answer at the home number and we have not heard from you through the emergency physician on call, we will assume that you have returned to your regular daily activities without incident.  SIGNATURES/CONFIDENTIALITY: You and/or your care partner have signed paperwork which will be entered into your electronic medical record.  These signatures attest to the fact that that the information above on your After Visit Summary has been reviewed and is understood.  Full responsibility of   the confidentiality of this discharge information lies with you and/or your care-partner. 

## 2013-08-20 NOTE — Progress Notes (Signed)
Called to room to assist during endoscopic procedure.  Patient ID and intended procedure confirmed with present staff. Received instructions for my participation in the procedure from the performing physician.  

## 2013-08-20 NOTE — Progress Notes (Signed)
Stable to RR 

## 2013-08-20 NOTE — Op Note (Signed)
[facility]  [address] [place], [id number]   ENDOSCOPY PROCEDURE REPORT  PATIENT: Karen Sanchez, Karen Sanchez  MR#: [id number] BIRTHDATE: 16-Nov-1985 , 27  yrs. old GENDER: Female ENDOSCOPIST: Ladene Artist, MD, Mar[facility] Regal REFERRED BY:  Abner Greenspan, M.D. PROCEDURE DATE:  08/20/2013 PROCEDURE:  EGD w/ biopsy ASA CLASS:     Class II INDICATIONS:  Nausea.   Vomiting.   Epigastric pain. MEDICATIONS: MAC sedation, administered by CRNA and propofol (Diprivan) 50mg  IV TOPICAL ANESTHETIC: none DESCRIPTION OF PROCEDURE: After the risks benefits and alternatives of the procedure were thoroughly explained, informed consent was obtained.  The LB [id number] endoscope was introduced through the mouth and advanced to the second portion of the duodenum. Without limitations.  The instrument was slowly withdrawn as the mucosa was fully examined.  ESOPHAGUS: The mucosa of the esophagus appeared normal. STOMACH: Mild gastritis was found in the gastric antrum and gastric body.  Multiple biopsies were performed.   A smooth sessile polyp measuring 4 mm in size was found in the gastric antrum.  Multiple biopsies was performed using hot forceps and cold snare.   The stomach otherwise appeared normal. DUODENUM: The duodenal mucosa showed no abnormalities in the bulb and second portion of the duodenum.  Retroflexed views revealed a small hiatal hernia.     The scope was then withdrawn from the patient and the procedure completed.  COMPLICATIONS: There were no complications.  ENDOSCOPIC IMPRESSION: 1.   Small hiatal hernia 2.   Gastritis in the gastric antrum and gastric body; multiple biopsies 3.   Sessile polyp measuring 4 mm in size in the gastric antrum; biopsied  RECOMMENDATIONS: 1.  Anti-reflux regimen 2.  PPI bid: omeprazole 40 mg po bid, 1 year supply 3.  Await pathology results  eSigned:  Ladene Artist, MD, [facility] 08/20/2013 11:15 AM

## 2013-08-21 ENCOUNTER — Telehealth: Payer: Self-pay | Admitting: *Deleted

## 2013-08-21 NOTE — Telephone Encounter (Signed)
No answer, message left for the patient. 

## 2013-08-26 ENCOUNTER — Encounter: Payer: Self-pay | Admitting: Gastroenterology

## 2013-08-28 ENCOUNTER — Encounter: Payer: Self-pay | Admitting: Neurology

## 2013-08-28 ENCOUNTER — Ambulatory Visit (INDEPENDENT_AMBULATORY_CARE_PROVIDER_SITE_OTHER): Payer: Federal, State, Local not specified - PPO | Admitting: Neurology

## 2013-08-28 VITALS — BP 100/64 | HR 92 | Resp 14 | Ht 63.0 in | Wt 149.0 lb

## 2013-08-28 DIAGNOSIS — R51 Headache: Secondary | ICD-10-CM | POA: Insufficient documentation

## 2013-08-28 DIAGNOSIS — R519 Headache, unspecified: Secondary | ICD-10-CM | POA: Insufficient documentation

## 2013-08-28 MED ORDER — NORTRIPTYLINE HCL 10 MG PO CAPS: ORAL_CAPSULE | ORAL | Status: DC

## 2013-08-28 NOTE — Patient Instructions (Addendum)
1. We have scheduled you at [facility] for your MRI on 09/08/2013 at 12:00 pm. Please arrive 15 minutes prior and go to 1st floor radiology. If you need to reschedule for any reason please call [phone]. 2. Start nortriptyline 10mg : Take 1 capsule at night for 1 week, then increase to 2 caps at night for 1 week, then increase to 3 caps at night 3. Keep a headache diary
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
## 2013-09-01 ENCOUNTER — Encounter: Payer: Self-pay | Admitting: Neurology

## 2013-09-08 ENCOUNTER — Ambulatory Visit (HOSPITAL_COMMUNITY): Admission: RE | Admit: 2013-09-08 | Payer: Federal, State, Local not specified - PPO | Source: Ambulatory Visit

## 2013-09-14 ENCOUNTER — Other Ambulatory Visit: Payer: Self-pay | Admitting: Family Medicine

## 2013-09-14 NOTE — Telephone Encounter (Signed)
Electronic refill request, please advise  

## 2013-09-14 NOTE — Telephone Encounter (Signed)
Please refill times one  

## 2013-09-15 NOTE — Telephone Encounter (Signed)
done

## 2013-09-17 ENCOUNTER — Inpatient Hospital Stay (HOSPITAL_COMMUNITY): Admission: RE | Admit: 2013-09-17 | Payer: Federal, State, Local not specified - PPO | Source: Ambulatory Visit

## 2013-09-22 ENCOUNTER — Ambulatory Visit (HOSPITAL_COMMUNITY)
Admission: RE | Admit: 2013-09-22 | Discharge: 2013-09-22 | Disposition: A | Discharge status: Home / Self Care | Payer: Federal, State, Local not specified - PPO | Source: Ambulatory Visit | Attending: Neurology | Admitting: Neurology

## 2013-09-22 DIAGNOSIS — R51 Headache: Secondary | ICD-10-CM | POA: Insufficient documentation

## 2013-09-22 MED ORDER — GADOBENATE DIMEGLUMINE 529 MG/ML IV SOLN
13.0000 mL | Freq: Once | INTRAVENOUS | Status: AC | PRN
  Administered 2013-09-22: 13 mL via INTRAVENOUS

## 2013-11-03 ENCOUNTER — Other Ambulatory Visit: Payer: Self-pay | Admitting: Family Medicine

## 2013-11-03 NOTE — Telephone Encounter (Signed)
Please refill times one  

## 2013-11-03 NOTE — Telephone Encounter (Signed)
Electronic refill request, please advise  

## 2013-11-03 NOTE — Telephone Encounter (Signed)
done

## 2014-07-12 ENCOUNTER — Ambulatory Visit (INDEPENDENT_AMBULATORY_CARE_PROVIDER_SITE_OTHER): Payer: Federal, State, Local not specified - PPO | Admitting: Family Medicine

## 2014-07-12 ENCOUNTER — Encounter: Payer: Self-pay | Admitting: Family Medicine

## 2014-07-12 VITALS — BP 98/70 | HR 69 | Temp 98.2°F | Ht 62.5 in | Wt 144.0 lb

## 2014-07-12 DIAGNOSIS — Z3049 Encounter for surveillance of other contraceptives: Secondary | ICD-10-CM | POA: Diagnosis not present

## 2014-07-12 DIAGNOSIS — Z3042 Encounter for surveillance of injectable contraceptive: Secondary | ICD-10-CM | POA: Insufficient documentation

## 2014-07-12 LAB — POCT URINE PREGNANCY: Preg Test, Ur: NEGATIVE

## 2014-07-12 MED ORDER — TERCONAZOLE 0.4 % VA CREA: 1.0000 | TOPICAL_CREAM | Freq: Every day | VAGINAL | Status: DC

## 2014-07-12 MED ORDER — MEDROXYPROGESTERONE ACETATE 150 MG/ML IM SUSP
150.0000 mg | Freq: Once | INTRAMUSCULAR | Status: AC
  Administered 2014-07-12: 150 mg via INTRAMUSCULAR

## 2014-07-12 MED ORDER — CETIRIZINE HCL 10 MG PO TABS: 10.0000 mg | ORAL_TABLET | Freq: Every day | ORAL | Status: DC

## 2014-07-12 NOTE — Progress Notes (Signed)
Pre visit review using our clinic review tool, if applicable. No additional management support is needed unless otherwise documented below in the visit note. 

## 2014-07-12 NOTE — Patient Instructions (Addendum)
Please schedule appt for annual gyn exam in approx 3 mo  Take care of yourself  If any problems with depo provera let me know

## 2014-07-12 NOTE — Progress Notes (Signed)
Subjective:    Patient ID: Karen Sanchez, female    DOB: 03/24/1986, 29 y.o.   MRN: [id number]  HPI Here wanting to start depo provera again  Loved it in the past -no menses after a year No problems   No birth control since then  Is sexually active  LMP was last week   Periods are regular  Last 4 d  Not too heavy or painful - just some headaches   Last pap 12/14 No hx of abnormal paps  Will schedule that   Patient Active Problem List   Diagnosis Date Noted  . Headache(784.0) 08/28/2013  . Viral gastroenteritis 06/30/2013  . Nausea alone 05/13/2013  . Routine general medical examination at a health care facility 03/11/2013  . Encounter for routine gynecological examination 03/11/2013  . Exposure to communicable disease 03/11/2013  . 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 BACK PAIN 07/10/2007  . HYPOGLYCEMIA 07/12/2006  . ANEMIA 07/12/2006   Past Medical History  Diagnosis Date  . Hypoglycemia   . Anxiety   . Anemia   . Depression    Past Surgical History  Procedure Laterality Date  . Induced abortion      age 49   History  Substance Use Topics  . Smoking status: Never Smoker   . Smokeless tobacco: Never Used  . Alcohol Use: No   Family History  Problem Relation Age of Onset  . Diabetes Mother   . Cancer Maternal Aunt     breast  . Cancer Paternal Aunt     breast  . Diabetes Father   . Diabetes Maternal Grandmother     paternal grandmother  . Breast cancer Maternal Aunt    Allergies  Allergen Reactions  . Citric Acid     Vaginal irritation   Current Outpatient Prescriptions on File Prior to Visit  Medication Sig Dispense Refill  . cetirizine (ZYRTEC) 10 MG tablet Take 10 mg by mouth daily.    Marland Kitchen omeprazole (PRILOSEC) 40 MG capsule Take 1 capsule (40 mg total) by mouth 2 (two) times daily. 30 capsule 11  . promethazine (PHENERGAN) 25 MG tablet TAKE 1 TABLET (25 MG TOTAL) BY MOUTH EVERY 8 (EIGHT) HOURS AS NEEDED FOR NAUSEA OR  VOMITING. 20 tablet 0  . traZODone (DESYREL) 50 MG tablet Take 1 tablet by mouth at bedtime as needed.     No current facility-administered medications on file prior to visit.      Review of Systems    Review of Systems  Constitutional: Negative for fever, appetite change, fatigue and unexpected weight change.  Eyes: Negative for pain and visual disturbance.  Respiratory: Negative for cough and shortness of breath.   Cardiovascular: Negative for cp or palpitations    Gastrointestinal: Negative for nausea, diarrhea and constipation.  Genitourinary: Negative for urgency and frequency.  Skin: Negative for pallor or rash   Neurological: Negative for weakness, light-headedness, numbness and headaches.  Hematological: Negative for adenopathy. Does not bruise/bleed easily.  Psychiatric/Behavioral: Negative for dysphoric mood. The patient is not nervous/anxious.      Objective:   Physical Exam  Constitutional: She appears well-developed and well-nourished. No distress.  HENT:  Head: Normocephalic and atraumatic.  Eyes: Conjunctivae and EOM are normal. Pupils are equal, round, and reactive to light.  Neck: Normal range of motion. Neck supple.  Cardiovascular: Normal rate and regular rhythm.   Pulmonary/Chest: Effort normal and breath sounds normal.  Abdominal: Soft. Bowel sounds are normal. She exhibits no distension. There  is no tenderness.  No suprapubic tenderness or fullness    Lymphadenopathy:    She has no cervical adenopathy.  Neurological: She is alert.  Skin: Skin is warm and dry.  Psychiatric: She has a normal mood and affect.          Assessment & Plan:   Problem List Items Addressed This Visit      Other   Depo contraception - Primary    Pt does well with this  It may minimize yeast vaginitis also  Will start first shot today after neg Upreg Understands schedule F/u 3 mo for gyn exam and pap      Relevant Medications   medroxyPROGESTERone (DEPO-PROVERA)  injection 150 mg (Completed)   Other Relevant Orders   POCT urine pregnancy (Completed)

## 2014-07-12 NOTE — Assessment & Plan Note (Signed)
Pt does well with this  It may minimize yeast vaginitis also  Will start first shot today after neg Upreg Understands schedule F/u 3 mo for gyn exam and pap

## 2014-09-28 ENCOUNTER — Ambulatory Visit (INDEPENDENT_AMBULATORY_CARE_PROVIDER_SITE_OTHER): Payer: Federal, State, Local not specified - PPO | Admitting: *Deleted

## 2014-09-28 DIAGNOSIS — Z3042 Encounter for surveillance of injectable contraceptive: Secondary | ICD-10-CM

## 2014-09-28 MED ORDER — MEDROXYPROGESTERONE ACETATE 150 MG/ML IM SUSP
150.0000 mg | Freq: Once | INTRAMUSCULAR | Status: AC
  Administered 2014-09-28: 150 mg via INTRAMUSCULAR

## 2014-09-28 NOTE — Patient Instructions (Signed)
Next dose due between September 6-20.

## 2014-10-06 ENCOUNTER — Ambulatory Visit (INDEPENDENT_AMBULATORY_CARE_PROVIDER_SITE_OTHER): Payer: Federal, State, Local not specified - PPO | Admitting: Family Medicine

## 2014-10-06 ENCOUNTER — Encounter: Payer: Self-pay | Admitting: Family Medicine

## 2014-10-06 VITALS — BP 114/68 | HR 74 | Temp 97.7°F | Wt 153.8 lb

## 2014-10-06 DIAGNOSIS — L255 Unspecified contact dermatitis due to plants, except food: Secondary | ICD-10-CM

## 2014-10-06 MED ORDER — FLUOCINONIDE-E 0.05 % EX CREA: 1.0000 "application " | TOPICAL_CREAM | Freq: Two times a day (BID) | CUTANEOUS | Status: DC

## 2014-10-06 MED ORDER — PREDNISONE 20 MG PO TABS: ORAL_TABLET | ORAL | Status: DC

## 2014-10-06 NOTE — Progress Notes (Signed)
Pre visit review using our clinic review tool, if applicable. No additional management support is needed unless otherwise documented below in the visit note. 

## 2014-10-06 NOTE — Patient Instructions (Signed)

## 2014-10-06 NOTE — Assessment & Plan Note (Signed)
New- oral pred taper given diffuse distribution. Topical lidex twice daily for no more than 10 days if needed. Oral antihistamine as needed for itching. Call or return to clinic prn if these symptoms worsen or fail to improve as anticipated. The patient indicates understanding of these issues and agrees with the plan.

## 2014-10-06 NOTE — Progress Notes (Signed)
Subjective:   Patient ID: Karen Sanchez, female    DOB: 08-Sep-1985, 29 y.o.   MRN: [id number]  Karen Sanchez is a pleasant 29 y.o. year old female pt of Dr. Glori Bickers, new to me, who presents to clinic today with Rash  on 10/06/2014  HPI:  Was outside working and developed very itchy, raised rash on her right arm that has now spread to all of her limbs.  Not painful.  No erythema or weeping.  Oral antihistamine and calamine lotion have only bee mildly effective.  Current Outpatient Prescriptions on File Prior to Visit  Medication Sig Dispense Refill  . DULoxetine (CYMBALTA) 60 MG capsule Take 60 mg by mouth daily.    Marland Kitchen omeprazole (PRILOSEC) 40 MG capsule Take 1 capsule (40 mg total) by mouth 2 (two) times daily. 30 capsule 11  . promethazine (PHENERGAN) 25 MG tablet TAKE 1 TABLET (25 MG TOTAL) BY MOUTH EVERY 8 (EIGHT) HOURS AS NEEDED FOR NAUSEA OR VOMITING. 20 tablet 0  . terconazole (TERAZOL 7) 0.4 % vaginal cream Place 1 applicator vaginally at bedtime. As needed for yeast infection 45 g 3  . traZODone (DESYREL) 50 MG tablet Take 1 tablet by mouth at bedtime as needed.     No current facility-administered medications on file prior to visit.    Allergies  Allergen Reactions  . Citric Acid     Vaginal irritation    Past Medical History  Diagnosis Date  . Hypoglycemia   . Anxiety   . Anemia   . Depression     Past Surgical History  Procedure Laterality Date  . Induced abortion      age 36    Family History  Problem Relation Age of Onset  . Diabetes Mother   . Cancer Maternal Aunt     breast  . Cancer Paternal Aunt     breast  . Diabetes Father   . Diabetes Maternal Grandmother     paternal grandmother  . Breast cancer Maternal Aunt     History   Social History  . Marital Status: Single    Spouse Name: N/A  . Number of Children: N/A  . Years of Education: N/A   Occupational History  . Not on file.   Social History Main Topics  . Smoking  status: Never Smoker   . Smokeless tobacco: Never Used  . Alcohol Use: No  . Drug Use: No  . Sexual Activity: Not on file   Other Topics Concern  . Not on file   Social History Narrative   The PMH, PSH, Social History, Family History, Medications, and allergies have been reviewed in [facility], and have been updated if relevant.   Review of Systems  Constitutional: Negative.   Skin: Positive for rash.  Neurological: Negative.   All other systems reviewed and are negative.      Objective:    BP 114/68 mmHg  Pulse 74  Temp(Src) 97.7 F (36.5 C) (Oral)  Wt 153 lb 12 oz (69.741 kg)  SpO2 99%   Physical Exam  Constitutional: She is oriented to person, place, and time. She appears well-developed and well-nourished. No distress.  HENT:  Head: Normocephalic.  Eyes: Conjunctivae are normal.  Cardiovascular: Normal rate.   Pulmonary/Chest: Effort normal.  Musculoskeletal: Normal range of motion.  Neurological: She is alert and oriented to person, place, and time. No cranial nerve deficit.  Skin: Skin is warm and dry. Rash noted.     Psychiatric: She has a  normal mood and affect. Her behavior is normal. Judgment and thought content normal.  Nursing note and vitals reviewed.         Assessment & Plan:   Plant dermatitis No Follow-up on file.

## 2014-10-12 ENCOUNTER — Ambulatory Visit: Payer: Federal, State, Local not specified - PPO | Admitting: Family Medicine

## 2014-10-19 ENCOUNTER — Encounter: Payer: Self-pay | Admitting: Family Medicine

## 2014-10-19 ENCOUNTER — Ambulatory Visit (INDEPENDENT_AMBULATORY_CARE_PROVIDER_SITE_OTHER): Payer: Federal, State, Local not specified - PPO | Admitting: Family Medicine

## 2014-10-19 ENCOUNTER — Other Ambulatory Visit (HOSPITAL_COMMUNITY)
Admission: RE | Admit: 2014-10-19 | Discharge: 2014-10-19 | Disposition: A | Discharge status: Home / Self Care | Payer: Federal, State, Local not specified - PPO | Source: Ambulatory Visit | Attending: Family Medicine | Admitting: Family Medicine

## 2014-10-19 VITALS — BP 112/72 | HR 89 | Temp 98.2°F | Ht 62.5 in | Wt 155.5 lb

## 2014-10-19 DIAGNOSIS — Z01419 Encounter for gynecological examination (general) (routine) without abnormal findings: Secondary | ICD-10-CM | POA: Diagnosis not present

## 2014-10-19 DIAGNOSIS — Z3049 Encounter for surveillance of other contraceptives: Secondary | ICD-10-CM

## 2014-10-19 DIAGNOSIS — Z3042 Encounter for surveillance of injectable contraceptive: Secondary | ICD-10-CM | POA: Diagnosis not present

## 2014-10-19 LAB — POCT URINE PREGNANCY: Preg Test, Ur: NEGATIVE

## 2014-10-19 MED ORDER — MEDROXYPROGESTERONE ACETATE 150 MG/ML IM SUSP
150.0000 mg | Freq: Once | INTRAMUSCULAR | Status: AC
  Administered 2014-10-19: 150 mg via INTRAMUSCULAR

## 2014-10-19 NOTE — Assessment & Plan Note (Signed)
Exam with pap done today  No c/o  Doing well with depo provera-some spotting (as expected) Wants to continue it  Declines STD screening

## 2014-10-19 NOTE — Assessment & Plan Note (Signed)
Neg urine preg today  Depo shot given No c/o  Understands that this does not protect from STDs  Does not smoke

## 2014-10-19 NOTE — Progress Notes (Signed)
Pre visit review using our clinic review tool, if applicable. No additional management support is needed unless otherwise documented below in the visit note. 

## 2014-10-19 NOTE — Progress Notes (Signed)
Subjective:    Patient ID: Karen Sanchez, female    DOB: 06-03-1985, 29 y.o.   MRN: [id number]  HPI Here for annual routine gyn exam   Started depo provera last time  Not a lot of bleeding -just spotting (usually will stop menses)  Wt is up 2 lb with bmi of 27   No need for STD screening  No vaginal symptoms  Never had abn pap smear Not trying to get pregnant    Patient Active Problem List   Diagnosis Date Noted  . Plant dermatitis 10/06/2014  . Depo contraception 07/12/2014  . Headache(784.0) 08/28/2013  . Viral gastroenteritis 06/30/2013  . Nausea alone 05/13/2013  . Routine general medical examination at a health care facility 03/11/2013  . Encounter for routine gynecological examination 03/11/2013  . Exposure to communicable disease 03/11/2013  . 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 BACK PAIN 07/10/2007  . HYPOGLYCEMIA 07/12/2006  . ANEMIA 07/12/2006   Past Medical History  Diagnosis Date  . Hypoglycemia   . Anxiety   . Anemia   . Depression    Past Surgical History  Procedure Laterality Date  . Induced abortion      age 85   History  Substance Use Topics  . Smoking status: Never Smoker   . Smokeless tobacco: Never Used  . Alcohol Use: No   Family History  Problem Relation Age of Onset  . Diabetes Mother   . Cancer Maternal Aunt     breast  . Cancer Paternal Aunt     breast  . Diabetes Father   . Diabetes Maternal Grandmother     paternal grandmother  . Breast cancer Maternal Aunt    Allergies  Allergen Reactions  . Citric Acid     Vaginal irritation   Current Outpatient Prescriptions on File Prior to Visit  Medication Sig Dispense Refill  . DULoxetine (CYMBALTA) 60 MG capsule Take 60 mg by mouth daily.    Marland Kitchen omeprazole (PRILOSEC) 40 MG capsule Take 1 capsule (40 mg total) by mouth 2 (two) times daily. 30 capsule 11  . promethazine (PHENERGAN) 25 MG tablet TAKE 1 TABLET (25 MG TOTAL) BY MOUTH EVERY 8 (EIGHT) HOURS AS  NEEDED FOR NAUSEA OR VOMITING. 20 tablet 0  . terconazole (TERAZOL 7) 0.4 % vaginal cream Place 1 applicator vaginally at bedtime. As needed for yeast infection 45 g 3  . traZODone (DESYREL) 50 MG tablet Take 1 tablet by mouth at bedtime as needed.     No current facility-administered medications on file prior to visit.      Review of Systems Review of Systems  Constitutional: Negative for fever, appetite change, fatigue and unexpected weight change.  Eyes: Negative for pain and visual disturbance.  Respiratory: Negative for cough and shortness of breath.   Cardiovascular: Negative for cp or palpitations    Gastrointestinal: Negative for nausea, diarrhea and constipation.  Genitourinary: Negative for urgency and frequency. pos for mild vaginal spotting since starting depo provera  Skin: Negative for pallor or rash   Neurological: Negative for weakness, light-headedness, numbness and headaches.  Hematological: Negative for adenopathy. Does not bruise/bleed easily.  Psychiatric/Behavioral: Negative for dysphoric mood. The patient is not nervous/anxious.         Objective:   Physical Exam  Constitutional: She appears well-developed and well-nourished. No distress.  overwt and well app   Eyes: Conjunctivae and EOM are normal. Pupils are equal, round, and reactive to light.  Cardiovascular: Normal rate and regular  rhythm.   Pulmonary/Chest: Effort normal and breath sounds normal.  Abdominal: Soft. Bowel sounds are normal.  No suprapubic tenderness or fullness    Genitourinary: Vagina normal and uterus normal. No breast swelling, tenderness, discharge or bleeding. There is no rash, tenderness or lesion on the right labia. There is no rash, tenderness or lesion on the left labia. Uterus is not tender. Cervix exhibits no motion tenderness and no friability. Right adnexum displays no mass, no tenderness and no fullness. Left adnexum displays no mass, no tenderness and no fullness. No erythema  in the vagina. No vaginal discharge found.  Breast exam: No mass, nodules, thickening, tenderness, bulging, retraction, inflamation, nipple discharge or skin changes noted.  No axillary or clavicular LA.     Scant spotting from depo / brown d/c   Neurological: She is alert.  Skin: Skin is warm and dry. No rash noted. No pallor.  Psychiatric: She has a normal mood and affect.          Assessment & Plan:   Problem List Items Addressed This Visit    Depo contraception    Neg urine preg today  Depo shot given No c/o  Understands that this does not protect from STDs  Does not smoke        Encounter for routine gynecological examination - Primary    Exam with pap done today  No c/o  Doing well with depo provera-some spotting (as expected) Wants to continue it  Declines STD screening       Relevant Orders   Cytology - PAP    Other Visit Diagnoses    Encounter for surveillance of injectable contraceptive        Relevant Orders    POCT urine pregnancy (Completed)

## 2014-10-19 NOTE — Patient Instructions (Signed)
Pap and exam done today  Depo shot today  Please update me if any issues or problems Take care of yourself !

## 2014-10-21 LAB — CYTOLOGY - PAP

## 2014-11-12 ENCOUNTER — Telehealth: Payer: Self-pay | Admitting: Family Medicine

## 2014-11-12 NOTE — Telephone Encounter (Signed)
Spoke to pt and informed her letter is available for pickup from the front desk 

## 2014-11-12 NOTE — Telephone Encounter (Signed)
Pt called. She needs work note stating she was seen in our clinic by Dr. Deborra Medina on 10/06/14 and that is the reason she missed work.  Please advise and call pt when ready to be picked up.

## 2014-12-15 ENCOUNTER — Ambulatory Visit: Payer: Federal, State, Local not specified - PPO

## 2015-01-04 ENCOUNTER — Ambulatory Visit (INDEPENDENT_AMBULATORY_CARE_PROVIDER_SITE_OTHER): Payer: Federal, State, Local not specified - PPO | Admitting: *Deleted

## 2015-01-04 DIAGNOSIS — Z308 Encounter for other contraceptive management: Secondary | ICD-10-CM

## 2015-01-04 MED ORDER — MEDROXYPROGESTERONE ACETATE 150 MG/ML IM SUSP
150.0000 mg | Freq: Once | INTRAMUSCULAR | Status: AC
  Administered 2015-01-04: 150 mg via INTRAMUSCULAR

## 2015-02-18 ENCOUNTER — Emergency Department: Payer: Federal, State, Local not specified - PPO

## 2015-02-18 ENCOUNTER — Encounter: Payer: Self-pay | Admitting: Emergency Medicine

## 2015-02-18 ENCOUNTER — Emergency Department
Admission: EM | Admit: 2015-02-18 | Discharge: 2015-02-18 | Disposition: A | Discharge status: Home / Self Care | Payer: Federal, State, Local not specified - PPO | Attending: Emergency Medicine | Admitting: Emergency Medicine

## 2015-02-18 DIAGNOSIS — Y9389 Activity, other specified: Secondary | ICD-10-CM | POA: Insufficient documentation

## 2015-02-18 DIAGNOSIS — Y9289 Other specified places as the place of occurrence of the external cause: Secondary | ICD-10-CM | POA: Diagnosis not present

## 2015-02-18 DIAGNOSIS — Y998 Other external cause status: Secondary | ICD-10-CM | POA: Diagnosis not present

## 2015-02-18 DIAGNOSIS — S93401A Sprain of unspecified ligament of right ankle, initial encounter: Secondary | ICD-10-CM | POA: Diagnosis not present

## 2015-02-18 DIAGNOSIS — X58XXXA Exposure to other specified factors, initial encounter: Secondary | ICD-10-CM | POA: Insufficient documentation

## 2015-02-18 DIAGNOSIS — Z79899 Other long term (current) drug therapy: Secondary | ICD-10-CM | POA: Diagnosis not present

## 2015-02-18 DIAGNOSIS — S99921A Unspecified injury of right foot, initial encounter: Secondary | ICD-10-CM | POA: Diagnosis present

## 2015-02-18 NOTE — ED Provider Notes (Signed)
[facility] Emergency Department Provider Note  ____________________________________________  Time seen: Approximately 9:16 AM  I have reviewed the triage vital signs and the nursing notes.   HISTORY  Chief Complaint Foot Pain   HPI Karen Sanchez is a 29 y.o. female is here with complaint of right ankle pain since yesterday. Patient states that she "rolled her ankle" and has had pain since. She denies any previous fractures to her right ankle. Today pain is increased. Patient does not want any narcotics for her pain. Pain is increased with range of motion and weightbearing. Patient states elevation helps. Currently her pain is an 8 out of 10.   Past Medical History  Diagnosis Date  . Hypoglycemia   . Anxiety   . Anemia   . Depression     Patient Active Problem List   Diagnosis Date Noted  . Depo contraception 07/12/2014  . Headache(784.0) 08/28/2013  . Routine general medical examination at a health care facility 03/11/2013  . Encounter for routine gynecological examination 03/11/2013  . 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 HYPOGLYCEMIA 07/12/2006    Past Surgical History  Procedure Laterality Date  . Induced abortion      age 36    Current Outpatient Rx  Name  Route  Sig  Dispense  Refill  . cetirizine (ZYRTEC) 10 MG tablet   Oral   Take 10 mg by mouth daily.         Marland Kitchen desvenlafaxine (PRISTIQ) 100 MG 24 hr tablet   Oral   Take 100 mg by mouth daily.         . medroxyPROGESTERone (DEPO-PROVERA) 150 MG/ML injection   Intramuscular   Inject 150 mg into the muscle every 3 (three) months.         . DULoxetine (CYMBALTA) 60 MG capsule   Oral   Take 60 mg by mouth daily.         Marland Kitchen omeprazole (PRILOSEC) 40 MG capsule   Oral   Take 1 capsule (40 mg total) by mouth 2 (two) times daily.   30 capsule   11   . promethazine (PHENERGAN) 25 MG tablet      TAKE 1 TABLET (25 MG TOTAL) BY MOUTH EVERY 8 (EIGHT) HOURS  AS NEEDED FOR NAUSEA OR VOMITING.   20 tablet   0   . terconazole (TERAZOL 7) 0.4 % vaginal cream   Vaginal   Place 1 applicator vaginally at bedtime. As needed for yeast infection   45 g   3   . traZODone (DESYREL) 50 MG tablet   Oral   Take 1 tablet by mouth at bedtime as needed.           Allergies Citric acid  Family History  Problem Relation Age of Onset  . Diabetes Mother   . Cancer Maternal Aunt     breast  . Cancer Paternal Aunt     breast  . Diabetes Father   . Diabetes Maternal Grandmother     paternal grandmother  . Breast cancer Maternal Aunt     Social History Social History  Substance Use Topics  . Smoking status: Never Smoker   . Smokeless tobacco: Never Used  . Alcohol Use: No    Review of Systems Constitutional: No fever/chills Eyes: No visual changes. ENT: No sore throat. Cardiovascular: Denies chest pain. Respiratory: Denies shortness of breath. Gastrointestinal:   No nausea, no vomiting.   Musculoskeletal: Negative for back pain. Positive for right ankle  pain. Skin: Negative for rash. Neurological: Negative for headaches, focal weakness or numbness.  10-point ROS otherwise negative.  ____________________________________________   PHYSICAL EXAM:  VITAL SIGNS: ED Triage Vitals  Enc Vitals Group     BP 02/18/15 0848 121/83 mmHg     Pulse Rate 02/18/15 0849 73     Resp 02/18/15 0848 18     Temp 02/18/15 0849 97.6 F (36.4 C)     Temp Source 02/18/15 0848 Oral     SpO2 02/18/15 0849 100 %     Weight 02/18/15 0848 160 lb (72.576 kg)     Height 02/18/15 0848 5\' 3"  (1.6 m)     Head Cir --      Peak Flow --      Pain Score 02/18/15 0850 8     Pain Loc --      Pain Edu? --      Excl. in [place]? --     Constitutional: Alert and oriented. Well appearing and in no acute distress. Eyes: Conjunctivae are normal. PERRL. EOMI. Head: Atraumatic. Nose: No congestion/rhinnorhea. Neck: No stridor.   Cardiovascular: Normal rate, regular  rhythm. Grossly normal heart sounds.  Good peripheral circulation. Respiratory: Normal respiratory effort.  No retractions. Lungs CTAB. Gastrointestinal: Soft and nontender. No distention Musculoskeletal: Moderate tenderness on palpation of the right lateral malleolus and also dorsum of the right foot at the base of the malleolus. Range of motion is restricted secondary to pain. There is minimal swelling present. Motor sensory function intact. Neurologic:  Normal speech and language. No gross focal neurologic deficits are appreciated. No gait instability. Skin:  Skin is warm, dry and intact. No rash noted. There is some ecchymosis present lateral aspect. No abrasions were noted. Psychiatric: Mood and affect are normal. Speech and behavior are normal.  ____________________________________________   LABS (all labs ordered are listed, but only abnormal results are displayed)  Labs Reviewed - No data to display  RADIOLOGY  Right ankle x-ray per radiologist shows no fracture dislocation. I, Johnn Hai, personally viewed and evaluated these images (plain radiographs) as part of my medical decision making.  ____________________________________________   PROCEDURES  Procedure(s) performed: None  Critical Care performed: No  ____________________________________________   INITIAL IMPRESSION / ASSESSMENT AND PLAN / ED COURSE  Pertinent labs & imaging results that were available during my care of the patient were reviewed by me and considered in my medical decision making (see chart for details).  Patient was placed in a stirrup ankle splint. She is to use ice elevation. Ibuprofen or Tylenol as needed for pain. She will follow-up with orthopedist if any continued problems.  ____________________________________________   FINAL CLINICAL IMPRESSION(S) / ED DIAGNOSES  Final diagnoses:  Sprain of right ankle, initial encounter      Johnn Hai, PA-C 02/18/15  [place], MD 02/18/15 1233

## 2015-02-18 NOTE — ED Notes (Signed)
Pt reports "rolling" ankle yesterday, reports pain to right ankle and top of right foot.

## 2015-02-18 NOTE — Discharge Instructions (Signed)
Ankle Sprain An ankle sprain is an injury to the strong, fibrous tissues (ligaments) that hold your ankle bones together.  HOME CARE   Put ice on your ankle for 1-2 days or as told by your doctor.  Put ice in a plastic bag.  Place a towel between your skin and the bag.  Leave the ice on for 15-20 minutes at a time, every 2 hours while you are awake.  Only take medicine as told by your doctor.  Raise (elevate) your injured ankle above the level of your heart as much as possible for 2-3 days.  Use crutches if your doctor tells you to. Slowly put your own weight on the affected ankle. Use the crutches until you can walk without pain.  If you have a plaster splint:  Do not rest it on anything harder than a pillow for 24 hours.  Do not put weight on it.  Do not get it wet.  Take it off to shower or bathe.  If given, use an elastic wrap or support stocking for support. Take the wrap off if your toes lose feeling (numb), tingle, or turn cold or blue.  If you have an air splint:  Add or let out air to make it comfortable.  Take it off at night and to shower and bathe.  Wiggle your toes and move your ankle up and down often while you are wearing it. GET HELP IF:  You have rapidly increasing bruising or puffiness (swelling).  Your toes feel very cold.  You lose feeling in your foot.  Your medicine does not help your pain. GET HELP RIGHT AWAY IF:   Your toes lose feeling (numb) or turn blue.  You have severe pain that is increasing. MAKE SURE YOU:   Understand these instructions.  Will watch your condition.  Will get help right away if you are not doing well or get worse.   This information is not intended to replace advice given to you by your health care provider. Make sure you discuss any questions you have with your health care provider.   Document Released: 09/12/2007 Document Revised: 04/16/2014 Document Reviewed: 10/08/2011 Elsevier Interactive Patient  Education 2016 [place] and elevate her ankle as needed for pain and swelling. Ibuprofen or Tylenol as needed for pain. Wear splint for support Follow-up with Dr. Rudene Christians  if any continued problems.

## 2015-03-09 ENCOUNTER — Ambulatory Visit (INDEPENDENT_AMBULATORY_CARE_PROVIDER_SITE_OTHER): Payer: Federal, State, Local not specified - PPO | Admitting: Family Medicine

## 2015-03-09 VITALS — BP 110/76 | HR 92 | Temp 98.0°F | Resp 15 | Ht 64.0 in | Wt 163.4 lb

## 2015-03-09 DIAGNOSIS — J0100 Acute maxillary sinusitis, unspecified: Secondary | ICD-10-CM

## 2015-03-09 DIAGNOSIS — H9203 Otalgia, bilateral: Secondary | ICD-10-CM

## 2015-03-09 DIAGNOSIS — L237 Allergic contact dermatitis due to plants, except food: Secondary | ICD-10-CM | POA: Diagnosis not present

## 2015-03-09 MED ORDER — AZITHROMYCIN 250 MG PO TABS: ORAL_TABLET | ORAL | Status: DC

## 2015-03-09 MED ORDER — METHYLPREDNISOLONE 4 MG PO TBPK: ORAL_TABLET | ORAL | Status: DC

## 2015-03-09 NOTE — Progress Notes (Signed)
Chief Complaint:  Chief Complaint  Patient presents with  . OTHER    poison oak in both legs    HPI: Karen Sanchez is a 29 y.o. female who reports to [facility] today complaining of  1 week hx of sinus sxs and rash on legs up groin. Hx of severe poison oak allergies,using antihistamines and also other meds.  She has a hx of plant dermatitis, states that when she looks at poison oak she gets it. No SOB or CP or voice changes. She took an ointment that was given to her for itching and it did not help   She has some sinus sxs and also ear pain. She denies fevers or chills or throat pain.   Past Medical History  Diagnosis Date  . Hypoglycemia   . Anxiety   . Anemia   . Depression    Past Surgical History  Procedure Laterality Date  . Induced abortion      age 31   Social History   Social History  . Marital Status: Single    Spouse Name: N/A  . Number of Children: N/A  . Years of Education: N/A   Social History Main Topics  . Smoking status: Never Smoker   . Smokeless tobacco: Never Used  . Alcohol Use: No  . Drug Use: No  . Sexual Activity: Not Asked   Other Topics Concern  . None   Social History Narrative   Family History  Problem Relation Age of Onset  . Diabetes Mother   . Cancer Maternal Aunt     breast  . Cancer Paternal Aunt     breast  . Diabetes Father   . Diabetes Maternal Grandmother     paternal grandmother  . Breast cancer Maternal Aunt    Allergies  Allergen Reactions  . Citric Acid     Vaginal irritation   Prior to Admission medications   Medication Sig Start Date End Date Taking? Authorizing Provider  cetirizine (ZYRTEC) 10 MG tablet Take 10 mg by mouth daily.   Yes Historical Provider, MD  desvenlafaxine (PRISTIQ) 100 MG 24 hr tablet Take 100 mg by mouth daily.   Yes Historical Provider, MD  medroxyPROGESTERone (DEPO-PROVERA) 150 MG/ML injection Inject 150 mg into the muscle every 3 (three) months.   Yes Historical Provider,  MD  omeprazole (PRILOSEC) 40 MG capsule Take 1 capsule (40 mg total) by mouth 2 (two) times daily. 08/20/13  Yes Ladene Artist, MD  promethazine (PHENERGAN) 25 MG tablet TAKE 1 TABLET (25 MG TOTAL) BY MOUTH EVERY 8 (EIGHT) HOURS AS NEEDED FOR NAUSEA OR VOMITING. 11/03/13  Yes Abner Greenspan, MD  terconazole (TERAZOL 7) 0.4 % vaginal cream Place 1 applicator vaginally at bedtime. As needed for yeast infection 07/12/14  Yes Abner Greenspan, MD  traZODone (DESYREL) 50 MG tablet Take 1 tablet by mouth at bedtime as needed. 11/26/11  Yes Historical Provider, MD  azithromycin (ZITHROMAX) 250 MG tablet Take 2 tabs po now then 1 tab po daily fo rthe next 4 days 03/09/15   Thao P Le, DO  DULoxetine (CYMBALTA) 60 MG capsule Take 60 mg by mouth daily.    Historical Provider, MD  methylPREDNISolone (MEDROL DOSEPAK) 4 MG TBPK tablet Take as directed 03/09/15   Thao P Le, DO     ROS: The patient denies fevers, chills, night sweats, unintentional weight loss, chest pain, palpitations, wheezing, dyspnea on exertion, nausea, vomiting, abdominal pain, dysuria, hematuria, melena, numbness,  weakness, or tingling.   All other systems have been reviewed and were otherwise negative with the exception of those mentioned in the HPI and as above.    PHYSICAL EXAM: Filed Vitals:   03/09/15 1612  BP: 110/76  Pulse: 92  Temp: 98 F (36.7 C)  Resp: 15   Body mass index is 28.03 kg/(m^2).   General: Alert, no acute distress HEENT:  Normocephalic, atraumatic, oropharynx patent. EOMI, PERRLA Erythematous throat, no exudates, TM normal, + sinus tenderness, + erythematous/boggy nasal mucosa Cardiovascular:  Regular rate and rhythm, no rubs murmurs or gallops.  No Carotid bruits, radial pulse intact. No pedal edema.  Respiratory: Clear to auscultation bilaterally.  No wheezes, rales, or rhonchi.  No cyanosis, no use of accessory musculature Abdominal: No organomegaly, abdomen is soft and non-tender, positive bowel sounds. No  masses. Skin: No rashes. Neurologic: Facial musculature symmetric. Psychiatric: Patient acts appropriately throughout our interaction. Lymphatic: No cervical or submandibular lymphadenopathy Musculoskeletal: Gait intact. No edema, tenderness   LABS: Results for orders placed or performed in visit on 10/19/14  POCT urine pregnancy  Result Value Ref Range   Preg Test, Ur Negative Negative  Cytology - PAP  Result Value Ref Range   CYTOLOGY - PAP PAP RESULT      EKG/XRAY:   Primary read interpreted by Dr. Marin Comment at [facility].   ASSESSMENT/PLAN: Encounter Diagnoses  Name Primary?  . Acute maxillary sinusitis, recurrence not specified Yes  . Poison oak dermatitis   . Otalgia of both ears    Rx medrol dose pack Rx Azithromycin  otc cough meds prn  Fu prn   Gross sideeffects, risk and benefits, and alternatives of medications d/w patient. Patient is aware that all medications have potential sideeffects and we are unable to predict every sideeffect or drug-drug interaction that may occur.  Thao Le DO  03/09/2015 5:23 PM

## 2015-03-29 ENCOUNTER — Ambulatory Visit (INDEPENDENT_AMBULATORY_CARE_PROVIDER_SITE_OTHER): Payer: Federal, State, Local not specified - PPO | Admitting: *Deleted

## 2015-03-29 ENCOUNTER — Ambulatory Visit: Payer: Federal, State, Local not specified - PPO

## 2015-03-29 DIAGNOSIS — Z308 Encounter for other contraceptive management: Secondary | ICD-10-CM | POA: Diagnosis not present

## 2015-03-29 MED ORDER — MEDROXYPROGESTERONE ACETATE 150 MG/ML IM SUSP
150.0000 mg | Freq: Once | INTRAMUSCULAR | Status: AC
  Administered 2015-03-29: 150 mg via INTRAMUSCULAR

## 2015-03-30 ENCOUNTER — Ambulatory Visit: Payer: Federal, State, Local not specified - PPO

## 2015-04-06 ENCOUNTER — Ambulatory Visit (INDEPENDENT_AMBULATORY_CARE_PROVIDER_SITE_OTHER): Payer: Federal, State, Local not specified - PPO | Admitting: Family Medicine

## 2015-04-06 ENCOUNTER — Encounter: Payer: Self-pay | Admitting: Family Medicine

## 2015-04-06 VITALS — BP 108/80 | HR 96 | Temp 98.0°F | Ht 63.0 in | Wt 159.2 lb

## 2015-04-06 DIAGNOSIS — A084 Viral intestinal infection, unspecified: Secondary | ICD-10-CM

## 2015-04-06 MED ORDER — PROMETHAZINE HCL 25 MG/ML IJ SOLN
50.0000 mg | Freq: Once | INTRAMUSCULAR | Status: AC
  Administered 2015-04-06: 50 mg via INTRAMUSCULAR

## 2015-04-06 NOTE — Assessment & Plan Note (Signed)
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

## 2015-04-06 NOTE — Patient Instructions (Signed)
Shot of phenergan now  Go home and rest/sleep  Take sips of ginger ale (with bubbles stirred out) , and water and gatorade = as much as possible to prevent dehydration  pedialyte is ok also  Don't eat until you feel ready - start with BRAT (bananas /rice/apple sauce/toast) - and then gradually advance that when you are ready   If worse/ or or signs of dehydration- go to the ER for urgent evaluation and IV fluids   Get back on your regular medicines when you can    Update if not starting to improve in a week or if worsening

## 2015-04-06 NOTE — Progress Notes (Signed)
Pre visit review using our clinic review tool, if applicable. No additional management support is needed unless otherwise documented below in the visit note. 

## 2015-04-06 NOTE — Progress Notes (Signed)
Subjective:    Patient ID: Karen Sanchez, female    DOB: [birth year], 29 y.o.   MRN: [id number]  HPI Here for n/v/d   Started early Monday am  Vomiting and diarrhea  Also abdominal cramping and bloating   No blood in stool that she notices   Vomiting at least once per day (worse if she eats) Diarrhea is more often- watery stool   Trying to eat soup/ grilled cheese/crackers Ginger ale    Then developed body aches on Tuesday  Did not take temp at home - but had chills (likely fever)  Grandmother has has the same symptoms   Both ate the same - ham and mac and cheese - on Sunday for lunch No appetite Sunday night - but felt fine   Has had EGD in the past -gastritis - is often intol of fatty foods  Last abd [place] nl 5/15  Patient Active Problem List   Diagnosis Date Noted  . Viral gastroenteritis 04/06/2015  . Depo contraception 07/12/2014  . Headache(784.0) 08/28/2013  . Routine general medical examination at a health care facility 03/11/2013  . Encounter for routine gynecological examination 03/11/2013  . 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 HYPOGLYCEMIA 07/12/2006   Past Medical History  Diagnosis Date  . Hypoglycemia   . Anxiety   . Anemia   . Depression    Past Surgical History  Procedure Laterality Date  . Induced abortion      age 11   Social History  Substance Use Topics  . Smoking status: Never Smoker   . Smokeless tobacco: Never Used  . Alcohol Use: No   Family History  Problem Relation Age of Onset  . Diabetes Mother   . Cancer Maternal Aunt     breast  . Cancer Paternal Aunt     breast  . Diabetes Father   . Diabetes Maternal Grandmother     paternal grandmother  . Breast cancer Maternal Aunt    Allergies  Allergen Reactions  . Citric Acid     Vaginal irritation   Current Outpatient Prescriptions on File Prior to Visit  Medication Sig Dispense Refill  . cetirizine (ZYRTEC) 10 MG tablet Take 10 mg by mouth daily.      Marland Kitchen desvenlafaxine (PRISTIQ) 100 MG 24 hr tablet Take 100 mg by mouth daily.    . medroxyPROGESTERone (DEPO-PROVERA) 150 MG/ML injection Inject 150 mg into the muscle every 3 (three) months.    Marland Kitchen omeprazole (PRILOSEC) 40 MG capsule Take 1 capsule (40 mg total) by mouth 2 (two) times daily. 30 capsule 11  . promethazine (PHENERGAN) 25 MG tablet TAKE 1 TABLET (25 MG TOTAL) BY MOUTH EVERY 8 (EIGHT) HOURS AS NEEDED FOR NAUSEA OR VOMITING. 20 tablet 0  . terconazole (TERAZOL 7) 0.4 % vaginal cream Place 1 applicator vaginally at bedtime. As needed for yeast infection 45 g 3  . traZODone (DESYREL) 50 MG tablet Take 1 tablet by mouth at bedtime as needed.    Marland Kitchen azithromycin (ZITHROMAX) 250 MG tablet Take 2 tabs po now then 1 tab po daily fo rthe next 4 days (Patient not taking: Reported on 04/06/2015) 6 tablet 0  . DULoxetine (CYMBALTA) 60 MG capsule Take 60 mg by mouth daily. Reported on 04/06/2015    . methylPREDNISolone (MEDROL DOSEPAK) 4 MG TBPK tablet Take as directed (Patient not taking: Reported on 04/06/2015) 21 tablet 0   No current facility-administered medications on file prior to visit.  Review of Systems Review of Systems  Constitutional: pos for malaise , poss fever at home  ENT neg for ST Eyes: Negative for pain and visual disturbance.  Respiratory: Negative for cough and shortness of breath.   Cardiovascular: Negative for cp or palpitations    Gastrointestinal: neg for blood in stool or focal abd pain .  Genitourinary: Negative for urgency and frequency.  Skin: Negative for pallor or rash   Neurological: Negative for weakness, light-headedness, numbness and headaches.  Hematological: Negative for adenopathy. Does not bruise/bleed easily.  Psychiatric/Behavioral: Negative for dysphoric mood. The patient is not nervous/anxious.         Objective:   Physical Exam  Constitutional: She appears well-developed and well-nourished. No distress.  Well but fatigued appearing    HENT:  Head: Normocephalic and atraumatic.  Mouth/Throat: Oropharynx is clear and moist.  MMM  Eyes: Conjunctivae and EOM are normal. Pupils are equal, round, and reactive to light. No scleral icterus.  Neck: Normal range of motion. Neck supple.  Cardiovascular: Normal rate, regular rhythm and normal heart sounds.   Pulmonary/Chest: Effort normal and breath sounds normal. No respiratory distress. She has no wheezes. She has no rales.  Abdominal: Soft. Normal appearance and bowel sounds are normal. She exhibits no distension, no abdominal bruit, no pulsatile midline mass and no mass. There is no hepatosplenomegaly. There is generalized tenderness. There is no rigidity, no rebound, no guarding, no CVA tenderness, no tenderness at McBurney's point and negative Murphy's sign.  Diffuse mild abd tenderness in all areas  No rebound or guarding  Musculoskeletal: She exhibits no edema.  Lymphadenopathy:    She has no cervical adenopathy.  Neurological: She is alert.  Skin: Skin is warm and dry. No erythema. No pallor.  Psychiatric: She has a normal mood and affect.          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

## 2015-06-16 ENCOUNTER — Ambulatory Visit (INDEPENDENT_AMBULATORY_CARE_PROVIDER_SITE_OTHER): Payer: Federal, State, Local not specified - PPO | Admitting: *Deleted

## 2015-06-16 DIAGNOSIS — Z308 Encounter for other contraceptive management: Secondary | ICD-10-CM

## 2015-06-16 MED ORDER — MEDROXYPROGESTERONE ACETATE 150 MG/ML IM SUSP
150.0000 mg | Freq: Once | INTRAMUSCULAR | Status: AC
  Administered 2015-06-16: 150 mg via INTRAMUSCULAR

## 2015-08-05 ENCOUNTER — Ambulatory Visit: Payer: Federal, State, Local not specified - PPO | Admitting: Family Medicine

## 2015-08-08 ENCOUNTER — Ambulatory Visit (INDEPENDENT_AMBULATORY_CARE_PROVIDER_SITE_OTHER): Payer: Federal, State, Local not specified - PPO | Admitting: Internal Medicine

## 2015-08-08 ENCOUNTER — Encounter: Payer: Self-pay | Admitting: Internal Medicine

## 2015-08-08 VITALS — BP 104/68 | HR 73 | Temp 98.0°F | Wt 161.2 lb

## 2015-08-08 DIAGNOSIS — H66002 Acute suppurative otitis media without spontaneous rupture of ear drum, left ear: Secondary | ICD-10-CM | POA: Diagnosis not present

## 2015-08-08 DIAGNOSIS — J0100 Acute maxillary sinusitis, unspecified: Secondary | ICD-10-CM

## 2015-08-08 MED ORDER — AMOXICILLIN-POT CLAVULANATE 875-125 MG PO TABS: 1.0000 | ORAL_TABLET | Freq: Two times a day (BID) | ORAL | Status: DC

## 2015-08-08 NOTE — Patient Instructions (Signed)

## 2015-08-08 NOTE — Progress Notes (Signed)
HPI  Pt presents to the clinic today with c/o headache, facial pressure, sore throat, cough, and left ear fullness/hearing loss. This started 10 days ago. Her cough is productive with a green mucous and worse at night. She denies fever, chills of body aches. She  has a h/o allergies, and has been taking Zyrtec without relief.  She has not had sick contact that she is aware of.  Review of Systems    Past Medical History  Diagnosis Date  . Hypoglycemia   . Anxiety   . Anemia   . Depression     Family History  Problem Relation Age of Onset  . Diabetes Mother   . Cancer Maternal Aunt     breast  . Cancer Paternal Aunt     breast  . Diabetes Father   . Diabetes Maternal Grandmother     paternal grandmother  . Breast cancer Maternal Aunt     Social History   Social History  . Marital Status: Single    Spouse Name: N/A  . Number of Children: N/A  . Years of Education: N/A   Occupational History  . Not on file.   Social History Main Topics  . Smoking status: Never Smoker   . Smokeless tobacco: Never Used  . Alcohol Use: No  . Drug Use: No  . Sexual Activity: Not on file   Other Topics Concern  . Not on file   Social History Narrative    Allergies  Allergen Reactions  . Citric Acid     Vaginal irritation     Constitutional: Positive headache/frontal pressure. Denies fatigue and fever HEENT:  Positive eye pain, facial pain, nasal congestion, itchy eyes, and sore throat. L ear fullness/hearing loss. Denies eye redness runny nose or bloody nose. Respiratory: Positive cough. Denies difficulty breathing or shortness of breath.  Cardiovascular: Denies chest pain, chest tightness, palpitations or swelling in the hands or feet.   No other specific complaints in a complete review of systems (except as listed in HPI above).  Objective:  BP 104/68 mmHg  Pulse 73  Temp(Src) 98 F (36.7 C) (Tympanic)  Wt 161 lb 4 oz (73.143 kg)  SpO2 99%   General: Appears her  stated age, ill appearing in NAD. HEENT: Head: normal shape and size, maxillary sinus tenderness noted; Eyes: sclera white, no icterus, conjunctiva pink; Ears: Left TM red with abnormal light reflex, RightTM gray and intact with normal light reflex; Nose: mucosa boggy and moist, erythematous and inflamed, septum midline; Throat/Mouth: + PND. Teeth present, mucosa erythematous and moist, no exudate noted, no lesions or ulcerations noted.  Neck:  No adenopathy noted.  Cardiovascular: Normal rate and rhythm. S1,S2 noted.  No murmur, rubs or gallops noted.  Pulmonary/Chest: Normal effort and positive vesicular breath sounds. No respiratory distress. No wheezes, rales or ronchi noted.      Assessment & Plan:   Acute bacterial maxillary sinusitis with acute otitis media, left  eRx for Augmentin BID for 10 days Switch from Zyrtec to Allegra Use Flonase nasal spray if tolerable  RTC as needed or if symptoms persist.

## 2015-08-08 NOTE — Progress Notes (Signed)
Pre visit review using our clinic review tool, if applicable. No additional management support is needed unless otherwise documented below in the visit note. 

## 2015-09-06 ENCOUNTER — Ambulatory Visit: Payer: Federal, State, Local not specified - PPO

## 2015-09-08 ENCOUNTER — Ambulatory Visit (INDEPENDENT_AMBULATORY_CARE_PROVIDER_SITE_OTHER): Payer: Federal, State, Local not specified - PPO | Admitting: *Deleted

## 2015-09-08 DIAGNOSIS — Z3042 Encounter for surveillance of injectable contraceptive: Secondary | ICD-10-CM

## 2015-09-08 MED ORDER — MEDROXYPROGESTERONE ACETATE 150 MG/ML IM SUSP
150.0000 mg | Freq: Once | INTRAMUSCULAR | Status: AC
  Administered 2015-09-08: 150 mg via INTRAMUSCULAR

## 2015-11-10 ENCOUNTER — Encounter: Payer: Self-pay | Admitting: Podiatry

## 2015-11-10 ENCOUNTER — Ambulatory Visit (INDEPENDENT_AMBULATORY_CARE_PROVIDER_SITE_OTHER): Payer: Federal, State, Local not specified - PPO

## 2015-11-10 ENCOUNTER — Ambulatory Visit (INDEPENDENT_AMBULATORY_CARE_PROVIDER_SITE_OTHER): Payer: Federal, State, Local not specified - PPO | Admitting: Podiatry

## 2015-11-10 DIAGNOSIS — R52 Pain, unspecified: Secondary | ICD-10-CM

## 2015-11-10 DIAGNOSIS — M722 Plantar fascial fibromatosis: Secondary | ICD-10-CM | POA: Diagnosis not present

## 2015-11-10 NOTE — Progress Notes (Signed)
   Subjective:    Patient ID: Karen Sanchez, female    DOB: 01/10/1986, 30 y.o.   MRN: [id number]  HPI  30 year old female presents the office today for concerns of right heel pain which is been ongoing for 3-4 years. She has seen other physicians for this and she's had steroid injections. She's also had orthotics made she has tried changing her shoes. She has tried stretching as well as anti-inflammatories without much relief. She has the premature in the morning when she first gets up or after standing all day. She denies any numbness or tingling. No swelling or redness. No other complaints at this time.  Review of Systems  All other systems reviewed and are negative.      Objective:   Physical Exam General: AAO x3, NAD  Dermatological: Skin is warm, dry and supple bilateral. Nails x 10 are well manicured; remaining integument appears unremarkable at this time. There are no open sores, no preulcerative lesions, no rash or signs of infection present.  Vascular: Dorsalis Pedis artery and Posterior Tibial artery pedal pulses are 2/4 bilateral with immedate capillary fill time. There is no pain with calf compression, swelling, warmth, erythema.   Neruologic: Grossly intact via light touch bilateral. Vibratory intact via tuning fork bilateral. Protective threshold with Semmes Wienstein monofilament intact to all pedal sites bilateral.    Musculoskeletal: Tenderness to palpation along the plantar medial tubercle of the calcaneus at the insertion of plantar fascia on the left foot. There is no pain along the course of the plantar fascia within the arch of the foot. Plantar fascia appears to be intact. There is no pain with lateral compression of the calcaneus or pain with vibratory sensation. There is no pain along the course or insertion of the achilles tendon. No other areas of tenderness to bilateral lower extremities. Equinus is present. MMT 5/5.   Gait: Unassisted, Nonantalgic.     Assessment & Plan:  30 year old female chronic right heel pain, likely plantar fasciitis -Treatment options discussed including all alternatives, risks, and complications -Etiology of symptoms were discussed -X-rays were obtained and reviewed with the patient. No evidence of acute fracture. -At this time she is tried multiple conservative treatments without any relief. Discussed with her other options including EPAT, PT, MRI, surgery, immobilization and cam boot (which she cannot do with work).  -At this time she has elected to proceed with EPAT. Also discussed that she is to have good aggressive physical therapy and continue stretching exercises home for which she'll start with. At some point will possibly refer to physical therapy as well. -I'll see her back next week for her first treatment of EPAT  Celesta Gentile, DPM

## 2015-11-13 DIAGNOSIS — M722 Plantar fascial fibromatosis: Secondary | ICD-10-CM | POA: Insufficient documentation

## 2015-11-15 ENCOUNTER — Ambulatory Visit (INDEPENDENT_AMBULATORY_CARE_PROVIDER_SITE_OTHER): Payer: Federal, State, Local not specified - PPO | Admitting: Podiatry

## 2015-11-15 ENCOUNTER — Encounter: Payer: Self-pay | Admitting: Podiatry

## 2015-11-15 DIAGNOSIS — M722 Plantar fascial fibromatosis: Secondary | ICD-10-CM

## 2015-11-16 ENCOUNTER — Encounter: Payer: Self-pay | Admitting: Family Medicine

## 2015-11-16 ENCOUNTER — Ambulatory Visit (INDEPENDENT_AMBULATORY_CARE_PROVIDER_SITE_OTHER)
Admission: RE | Admit: 2015-11-16 | Discharge: 2015-11-16 | Disposition: A | Discharge status: Home / Self Care | Payer: Federal, State, Local not specified - PPO | Source: Ambulatory Visit | Attending: Family Medicine | Admitting: Family Medicine

## 2015-11-16 ENCOUNTER — Ambulatory Visit (INDEPENDENT_AMBULATORY_CARE_PROVIDER_SITE_OTHER): Payer: Federal, State, Local not specified - PPO | Admitting: Family Medicine

## 2015-11-16 VITALS — BP 110/76 | HR 93 | Temp 98.8°F | Wt 170.0 lb

## 2015-11-16 DIAGNOSIS — M25562 Pain in left knee: Secondary | ICD-10-CM | POA: Diagnosis not present

## 2015-11-16 DIAGNOSIS — M79641 Pain in right hand: Secondary | ICD-10-CM | POA: Insufficient documentation

## 2015-11-16 DIAGNOSIS — M2550 Pain in unspecified joint: Secondary | ICD-10-CM | POA: Diagnosis not present

## 2015-11-16 NOTE — Progress Notes (Signed)
Subjective:    Patient ID: Karen Sanchez, female    DOB: 12/12/1985, 30 y.o.   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  L knee hurts Grinds and pops  Ongoing since HS  She thinks she dislocated it in 12 th grade when she stepped up onto a chair   No red joints  No fever (does sweat easily)    Gets rashes/ little bumps that itch  Mostly arms (some face and chest)  Worse in the sun    Also has more foot pain  Plantar fasciitis   Gastritis -takes prilosec     Wt Readings from Last 3 Encounters:  11/16/15 170 lb (77.1 kg)  08/08/15 161 lb 4 oz (73.1 kg)  04/06/15 159 lb 4 oz (72.2 kg)   bmi is 30.1   Aunts and uncles have auto immune arthritis  [place] mother with OA   Patient Active Problem List   Diagnosis Date Noted  . Joint pain 11/16/2015  . Right hand pain 11/16/2015  . Left knee pain 11/16/2015  . Plantar fasciitis 11/13/2015  . Viral gastroenteritis 04/06/2015  . Depo contraception 07/12/2014  . Headache(784.0) 08/28/2013  . Routine general medical examination at a health care facility 03/11/2013  . Encounter for routine gynecological examination 03/11/2013  . 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 HYPOGLYCEMIA 07/12/2006   Past Medical History:  Diagnosis Date  . Anemia   . Anxiety   . Depression   . Hypoglycemia    Past Surgical History:  Procedure Laterality Date  . INDUCED ABORTION     age 54   Social History  Substance Use Topics  . Smoking status: Never Smoker  . Smokeless tobacco: Never Used  . Alcohol use No   Family History  Problem Relation Age of Onset  . Diabetes Mother   . Cancer  Maternal Aunt     breast  . Cancer Paternal Aunt     breast  . Diabetes Father   . Diabetes Maternal Grandmother     paternal grandmother  . Breast cancer Maternal Aunt    Allergies  Allergen Reactions  . Citric Acid     Vaginal irritation   Current Outpatient Prescriptions on File Prior to Visit  Medication Sig Dispense Refill  . desvenlafaxine (PRISTIQ) 100 MG 24 hr tablet Take 100 mg by mouth daily.    . medroxyPROGESTERone (DEPO-PROVERA) 150 MG/ML injection Inject 150 mg into the muscle every 3 (three) months.    Marland Kitchen omeprazole (PRILOSEC) 40 MG capsule Take 1 capsule (40 mg total) by mouth 2 (two) times daily. 30 capsule 11  . promethazine (PHENERGAN) 25 MG tablet TAKE 1 TABLET (25 MG TOTAL) BY MOUTH EVERY 8 (EIGHT) HOURS AS NEEDED FOR NAUSEA OR VOMITING. 20 tablet 0  . terconazole (TERAZOL 7) 0.4 % vaginal cream Place 1 applicator vaginally at bedtime. As needed for yeast infection 45 g 3  . traZODone (DESYREL) 50 MG tablet Take 1 tablet by mouth at bedtime as needed.     No current facility-administered medications on file prior to visit.     Review  of Systems    Review of Systems  Constitutional: Negative for fever, appetite change, and unexpected weight change.  Eyes: Negative for pain and visual disturbance.  Respiratory: Negative for cough and shortness of breath.   Cardiovascular: Negative for cp or palpitations    Gastrointestinal: Negative for nausea, diarrhea and constipation.  Genitourinary: Negative for urgency and frequency.  Skin: Negative for pallor or rash   MSK pos for joint pain w/o swelling  Neurological: Negative for weakness, light-headedness, numbness and headaches.  Hematological: Negative for adenopathy. Does not bruise/bleed easily.  Psychiatric/Behavioral: Negative for dysphoric mood. The patient is not nervous/anxious. Pos for stressors     Objective:   Physical Exam  Constitutional: She appears well-developed and well-nourished. No distress.    obese and well appearing   HENT:  Head: Normocephalic and atraumatic.  Mouth/Throat: Oropharynx is clear and moist.  Eyes: Conjunctivae and EOM are normal. Pupils are equal, round, and reactive to light.  Neck: Normal range of motion. Neck supple. No JVD present. Carotid bruit is not present. No thyromegaly present.  Cardiovascular: Normal rate, regular rhythm, normal heart sounds and intact distal pulses.  Exam reveals no gallop.   Pulmonary/Chest: Effort normal and breath sounds normal. No respiratory distress. She has no wheezes. She has no rales.  No crackles  Abdominal: Soft. Bowel sounds are normal. She exhibits no distension, no abdominal bruit and no mass. There is no tenderness.  Musculoskeletal: She exhibits tenderness. She exhibits no edema.   R hand-tenderness of MCP but no deformity orr crepitus Nl rom  Grip limited by pain/ not strength   L knee- tender over medial joint space Scant crepitus Nl patellar movement  No effusion or erythema or heat Nl gait   No joint swelling or redness diffusely  Lymphadenopathy:    She has no cervical adenopathy.  Neurological: She is alert. She has normal reflexes.  Skin: Skin is warm and dry. No rash noted. No pallor.  No rashes today  Psychiatric: She has a normal mood and affect.  Seems generally fatigued          Assessment & Plan:   Problem List Items Addressed This Visit      Other   Right hand pain    Xray today  Unremarkable exam except for tenderness  Lab for autoimmune process      Relevant Orders   DG Hand Complete Right (Completed)   Left knee pain    In setting of other joint pain  Did injure years ago Medial pain  No effusion  Xray today as well as rheumatoid labs       Relevant Orders   DG Knee AP/LAT W/Sunrise Left (Completed)   Joint pain    Large joints and hands w/o swelling Pt desires lab for auto immune arthritis since she has a family hx  Lab today and xr of R hand/ L knee where  symptoms are the worst      Relevant Orders   CBC with Differential/Platelet (Completed)   Sedimentation Rate (Completed)   Rheumatoid factor (Completed)   ANA (Completed)    Other Visit Diagnoses   None.

## 2015-11-16 NOTE — Progress Notes (Signed)
Subjective: 30 year old female presents the office today for concerns of right heel pain into her first treatment of EPAT. No acute changes since last appointment and no new concerns. No change in symptoms. Denies any systemic complaints such as fevers, chills, nausea, vomiting. No acute changes since last appointment, and no other complaints at this time.   Objective: AAO x3, NAD DP/PT pulses palpable bilaterally, CRT less than 3 seconds Tenderness to palpation along the plantar medial tubercle of the calcaneus at the insertion of plantar fascia on the right foot. There is no pain along the course of the plantar fascia within the arch of the foot. Plantar fascia appears to be intact. There is no pain with lateral compression of the calcaneus or pain with vibratory sensation. There is no pain along the course or insertion of the achilles tendon. No other areas of tenderness to bilateral lower extremities. No edema, erythema, increase in warmth to bilateral lower extremities.  No open lesions or pre-ulcerative lesions.  No pain with calf compression, swelling, warmth, erythema Exam is unchanged   Assessment: Right plantar fasciitis  Plan: -All treatment options discussed with the patient including all alternatives, risks, complications.  -At this time she wishes to go ahead and proceed with her first treatment of EPAT. This was completed without complications with settings of 3000, 15, 2.0 in the right heel.  -Off on anti-inflammatories. -Follow-up one week  -Patient encouraged to call the office with any questions, concerns, change in symptoms.   Celesta Gentile, DPM

## 2015-11-16 NOTE — Patient Instructions (Signed)
Labs today for auto immune joint disease Xray of hand and knee  We will contact you with results

## 2015-11-17 LAB — CBC WITH DIFFERENTIAL/PLATELET
BASOS PCT: 0.4 % (ref 0.0–3.0)
Basophils Absolute: 0 10*3/uL (ref 0.0–0.1)
EOS PCT: 2.9 % (ref 0.0–5.0)
Eosinophils Absolute: 0.3 10*3/uL (ref 0.0–0.7)
HCT: 40.8 % (ref 36.0–46.0)
Hemoglobin: 13.8 g/dL (ref 12.0–15.0)
LYMPHS ABS: 2.4 10*3/uL (ref 0.7–4.0)
Lymphocytes Relative: 23 % (ref 12.0–46.0)
MCHC: 33.8 g/dL (ref 30.0–36.0)
MCV: 85.2 fl (ref 78.0–100.0)
MONO ABS: 1.1 10*3/uL — AB (ref 0.1–1.0)
Monocytes Relative: 10.8 % (ref 3.0–12.0)
NEUTROS PCT: 62.9 % (ref 43.0–77.0)
Neutro Abs: 6.5 10*3/uL (ref 1.4–7.7)
PLATELETS: 326 10*3/uL (ref 150.0–400.0)
RBC: 4.79 Mil/uL (ref 3.87–5.11)
RDW: 13.2 % (ref 11.5–15.5)
WBC: 10.3 10*3/uL (ref 4.0–10.5)

## 2015-11-17 LAB — SEDIMENTATION RATE: SED RATE: 9 mm/h (ref 0–20)

## 2015-11-17 LAB — ANA: Anti Nuclear Antibody(ANA): NEGATIVE

## 2015-11-17 LAB — RHEUMATOID FACTOR: Rhuematoid fact SerPl-aCnc: <10 IU/mL (ref <=14)

## 2015-11-17 NOTE — Assessment & Plan Note (Signed)
Large joints and hands w/o swelling Pt desires lab for auto immune arthritis since she has a family hx  Lab today and xr of R hand/ L knee where symptoms are the worst

## 2015-11-17 NOTE — Assessment & Plan Note (Signed)
In setting of other joint pain  Did injure years ago Medial pain  No effusion  Xray today as well as rheumatoid labs

## 2015-11-17 NOTE — Assessment & Plan Note (Signed)
Xray today  Unremarkable exam except for tenderness  Lab for autoimmune process

## 2015-11-22 ENCOUNTER — Encounter: Payer: Self-pay | Admitting: Podiatry

## 2015-11-22 ENCOUNTER — Ambulatory Visit (INDEPENDENT_AMBULATORY_CARE_PROVIDER_SITE_OTHER): Payer: Federal, State, Local not specified - PPO | Admitting: Podiatry

## 2015-11-22 DIAGNOSIS — M79673 Pain in unspecified foot: Secondary | ICD-10-CM

## 2015-11-22 DIAGNOSIS — M722 Plantar fascial fibromatosis: Secondary | ICD-10-CM

## 2015-11-27 NOTE — Progress Notes (Signed)
Subjective: 30 year old female presents the office today for her 2nd EPAT treatment. She states she is doing better. Some discomfort for the 1st day after the last treatment but no other issues. Denies any systemic complaints such as fevers, chills, nausea, vomiting. No acute changes since last appointment, and no other complaints at this time.   Objective: AAO x3, NAD DP/PT pulses palpable bilaterally, CRT less than 3 seconds There is continued tenderness to palpation along the plantar medial tubercle of the calcaneus at the insertion of plantar fascia on the right foot. There is no pain along the course of the plantar fascia within the arch of the foot. Plantar fascia appears to be intact. There is no pain with lateral compression of the calcaneus.. There is no pain along the course or insertion of the achilles tendon. No other areas of tenderness to bilateral lower extremities. No edema, erythema, increase in warmth to bilateral lower extremities.  No open lesions or pre-ulcerative lesions.  No pain with calf compression, swelling, warmth, erythema Exam is unchanged   Assessment: Right plantar fasciitis  Plan: -All treatment options discussed with the patient including all alternatives, risks, complications.  -At this time she wishes to go ahead and proceed with her 2nd treatment of EPAT. This was completed without complications with settings of 3000, 18, 2.6 in the right heel and in the right arch 2.2, 1500, 18.   -Off on anti-inflammatories. -Follow-up one week  -Patient encouraged to call the office with any questions, concerns, change in symptoms.   Celesta Gentile, DPM

## 2015-11-29 ENCOUNTER — Ambulatory Visit (INDEPENDENT_AMBULATORY_CARE_PROVIDER_SITE_OTHER): Payer: Federal, State, Local not specified - PPO | Admitting: Podiatry

## 2015-11-29 DIAGNOSIS — M79673 Pain in unspecified foot: Secondary | ICD-10-CM

## 2015-11-29 DIAGNOSIS — M722 Plantar fascial fibromatosis: Secondary | ICD-10-CM

## 2015-12-01 ENCOUNTER — Ambulatory Visit (INDEPENDENT_AMBULATORY_CARE_PROVIDER_SITE_OTHER): Payer: Federal, State, Local not specified - PPO | Admitting: *Deleted

## 2015-12-01 DIAGNOSIS — Z308 Encounter for other contraceptive management: Secondary | ICD-10-CM | POA: Diagnosis not present

## 2015-12-01 MED ORDER — MEDROXYPROGESTERONE ACETATE 150 MG/ML IM SUSP
150.0000 mg | Freq: Once | INTRAMUSCULAR | Status: AC
  Administered 2015-12-01: 150 mg via INTRAMUSCULAR

## 2015-12-04 NOTE — Progress Notes (Signed)
Subjective: 30 year old female presents the office today for her 3rd EPAT treatment. She has had some improvement in pain but it does continue. Denies any systemic complaints such as fevers, chills, nausea, vomiting. No acute changes since last appointment, and no other complaints at this time.   Objective: AAO x3, NAD DP/PT pulses palpable bilaterally, CRT less than 3 seconds There is continued but somewhat improved tenderness to palpation along the plantar medial tubercle of the calcaneus at the insertion of plantar fascia on the right foot. There is no pain along the course of the plantar fascia within the arch of the foot. Plantar fascia appears to be intact. There is no pain with lateral compression of the calcaneus.There is no pain along the course or insertion of the achilles tendon. No other areas of tenderness to bilateral lower extremities. No edema, erythema, increase in warmth to bilateral lower extremities.  No open lesions or pre-ulcerative lesions.  No pain with calf compression, swelling, warmth, erythema Exam is unchanged   Assessment: Right plantar fasciitis  Plan: -All treatment options discussed with the patient including all alternatives, risks, complications.  -At this time she wishes to go ahead and proceed with her 3rd treatment of EPAT. This was completed without complications with settings of 3000, 20, 4 in the right heel and in the right arch 2.2, 1800, 20.   -Off on anti-inflammatories. -Follow-up one week  -Patient encouraged to call the office with any questions, concerns, change in symptoms.   Celesta Gentile, DPM

## 2015-12-20 ENCOUNTER — Telehealth: Payer: Self-pay | Admitting: Podiatry

## 2015-12-20 NOTE — Telephone Encounter (Signed)
Per Jessy Oto charges were never entered for this patient for date of service 11/29/15 with [facility]. I have entered an administrative charge today for this EPAT for $600.00 per request.

## 2015-12-29 ENCOUNTER — Ambulatory Visit: Payer: Federal, State, Local not specified - PPO | Admitting: Podiatry

## 2016-01-19 ENCOUNTER — Encounter: Payer: Self-pay | Admitting: Family Medicine

## 2016-01-19 ENCOUNTER — Other Ambulatory Visit: Payer: Self-pay | Admitting: Family Medicine

## 2016-01-19 DIAGNOSIS — R1013 Epigastric pain: Secondary | ICD-10-CM

## 2016-01-19 DIAGNOSIS — R112 Nausea with vomiting, unspecified: Secondary | ICD-10-CM

## 2016-01-19 MED ORDER — OMEPRAZOLE 40 MG PO CPDR: 40.0000 mg | DELAYED_RELEASE_CAPSULE | Freq: Two times a day (BID) | ORAL | 0 refills | Status: DC

## 2016-01-19 NOTE — Telephone Encounter (Signed)
Last office visit 11/16/2015.  Last refilled 11/03/2013 for #20 with no refills.

## 2016-01-19 NOTE — Telephone Encounter (Signed)
Please refill times one  

## 2016-01-20 MED ORDER — PROMETHAZINE HCL 25 MG PO TABS: 25.0000 mg | ORAL_TABLET | Freq: Three times a day (TID) | ORAL | 1 refills | Status: DC | PRN

## 2016-01-20 NOTE — Telephone Encounter (Signed)
done

## 2016-01-23 DIAGNOSIS — F3342 Major depressive disorder, recurrent, in full remission: Secondary | ICD-10-CM | POA: Diagnosis not present

## 2016-02-16 ENCOUNTER — Ambulatory Visit (INDEPENDENT_AMBULATORY_CARE_PROVIDER_SITE_OTHER): Payer: Federal, State, Local not specified - PPO | Admitting: *Deleted

## 2016-02-16 DIAGNOSIS — Z23 Encounter for immunization: Secondary | ICD-10-CM

## 2016-02-16 DIAGNOSIS — Z3042 Encounter for surveillance of injectable contraceptive: Secondary | ICD-10-CM

## 2016-02-16 MED ORDER — MEDROXYPROGESTERONE ACETATE 150 MG/ML IM SUSP
150.0000 mg | INTRAMUSCULAR | Status: DC
  Administered 2016-02-16: 150 mg via INTRAMUSCULAR

## 2016-02-26 ENCOUNTER — Other Ambulatory Visit: Payer: Self-pay | Admitting: Family Medicine

## 2016-02-26 DIAGNOSIS — R1013 Epigastric pain: Secondary | ICD-10-CM

## 2016-02-26 DIAGNOSIS — R112 Nausea with vomiting, unspecified: Secondary | ICD-10-CM

## 2016-02-27 NOTE — Telephone Encounter (Signed)
Please schedule late spring 30 min f/u or PE (whichever she pref) and refill until then

## 2016-02-27 NOTE — Telephone Encounter (Signed)
No recent/future f/u or CPE appts., please advise

## 2016-02-28 NOTE — Telephone Encounter (Signed)
appt scheduled and med refilled 

## 2016-05-10 ENCOUNTER — Ambulatory Visit (INDEPENDENT_AMBULATORY_CARE_PROVIDER_SITE_OTHER): Payer: Federal, State, Local not specified - PPO | Admitting: *Deleted

## 2016-05-10 DIAGNOSIS — Z3042 Encounter for surveillance of injectable contraceptive: Secondary | ICD-10-CM | POA: Diagnosis not present

## 2016-05-10 MED ORDER — MEDROXYPROGESTERONE ACETATE 150 MG/ML IM SUSP
150.0000 mg | Freq: Once | INTRAMUSCULAR | Status: AC
  Administered 2016-05-10: 150 mg via INTRAMUSCULAR

## 2016-05-25 ENCOUNTER — Telehealth: Payer: Self-pay | Admitting: Family Medicine

## 2016-05-25 ENCOUNTER — Ambulatory Visit: Payer: Federal, State, Local not specified - PPO | Admitting: Family Medicine

## 2016-05-25 DIAGNOSIS — K2950 Unspecified chronic gastritis without bleeding: Secondary | ICD-10-CM

## 2016-05-25 DIAGNOSIS — K2970 Gastritis, unspecified, without bleeding: Secondary | ICD-10-CM | POA: Insufficient documentation

## 2016-05-25 NOTE — Telephone Encounter (Signed)
Patient Name: Karen Sanchez  DOB: 06-04-1985    Initial Comment Caller has gastritis, feels like it's getting worse, abdominal pain, diarrhea, vomiting.    Nurse Assessment  Nurse: Raphael Gibney, RN, Vanita Ingles Date/Time (Eastern Time): 05/25/2016 11:20:06 AM  Confirm and document reason for call. If symptomatic, describe symptoms. ---Caller states she was diagnosed with gastritis about 3 years ago. Gastritis has gotten worse the past 2 weeks. Vomiting and diarrhea. Has upper abd pain. Pain is constant and gets worse when she eats. Pain level 6-7.  Does the patient have any new or worsening symptoms? ---Yes  Will a triage be completed? ---Yes  Related visit to physician within the last 2 weeks? ---No  Does the PT have any chronic conditions? (i.e. diabetes, asthma, etc.) ---Yes  List chronic conditions. ---gastritis  Is the patient pregnant or possibly pregnant? (Ask all females between the ages of 55-55) ---No  Is this a behavioral health or substance abuse call? ---No     Guidelines    Guideline Title Affirmed Question Affirmed Notes  Abdominal Pain - Upper [1] MILD-MODERATE pain AND [2] constant AND [3] present > 2 hours    Final Disposition User   See Physician within 4 Hours (or PCP triage) Raphael Gibney, RN, Vera    Comments  pt states she had an appt for today and cancelled it as she did not feel like driving to the office. She wants to know if Dr Glori Bickers wants her to go back to the gastroenterologist or treat her at [facility]. Please call pt back   Referrals  REFERRED TO PCP OFFICE  GO TO FACILITY REFUSED  REFERRED TO PCP OFFICE   Disagree/Comply: Disagree  Disagree/Comply Reason: Disagree with instructions

## 2016-05-25 NOTE — Telephone Encounter (Signed)
Ref done  Will route to [facility] 

## 2016-05-25 NOTE — Telephone Encounter (Signed)
Pt has been taking omeprazole as prescribed with no relief, pt does want to go back to the GI doctor, she went to [facility] in the past but it's been over 3 yrs so she would need a new referral. Please put referral in and I advise pt our [facility] will call to schedule appt

## 2016-05-25 NOTE — Telephone Encounter (Signed)
If she continues to take her omeprazole and still has symptoms then she should go back to GI  Does she need a referral?

## 2016-05-28 NOTE — Telephone Encounter (Signed)
[name] Appt made and patient aware. Karen Sanchez

## 2016-06-19 ENCOUNTER — Ambulatory Visit: Payer: Federal, State, Local not specified - PPO | Admitting: Family Medicine

## 2016-06-27 ENCOUNTER — Other Ambulatory Visit (INDEPENDENT_AMBULATORY_CARE_PROVIDER_SITE_OTHER): Payer: Federal, State, Local not specified - PPO

## 2016-06-27 ENCOUNTER — Ambulatory Visit (INDEPENDENT_AMBULATORY_CARE_PROVIDER_SITE_OTHER): Payer: Federal, State, Local not specified - PPO | Admitting: Gastroenterology

## 2016-06-27 ENCOUNTER — Encounter: Payer: Self-pay | Admitting: Gastroenterology

## 2016-06-27 VITALS — BP 100/80 | HR 80 | Ht 62.5 in | Wt 170.5 lb

## 2016-06-27 DIAGNOSIS — R1084 Generalized abdominal pain: Secondary | ICD-10-CM

## 2016-06-27 DIAGNOSIS — R197 Diarrhea, unspecified: Secondary | ICD-10-CM | POA: Diagnosis not present

## 2016-06-27 DIAGNOSIS — G43A0 Cyclical vomiting, not intractable: Secondary | ICD-10-CM | POA: Diagnosis not present

## 2016-06-27 DIAGNOSIS — R1115 Cyclical vomiting syndrome unrelated to migraine: Secondary | ICD-10-CM

## 2016-06-27 LAB — CBC WITH DIFFERENTIAL/PLATELET
BASOS ABS: 0.1 10*3/uL (ref 0.0–0.1)
Basophils Relative: 0.7 % (ref 0.0–3.0)
EOS PCT: 4.3 % (ref 0.0–5.0)
Eosinophils Absolute: 0.3 10*3/uL (ref 0.0–0.7)
HCT: 42 % (ref 36.0–46.0)
HEMOGLOBIN: 14 g/dL (ref 12.0–15.0)
LYMPHS ABS: 1.7 10*3/uL (ref 0.7–4.0)
LYMPHS PCT: 25.4 % (ref 12.0–46.0)
MCHC: 33.3 g/dL (ref 30.0–36.0)
MCV: 85.8 fl (ref 78.0–100.0)
MONOS PCT: 12.3 % — AB (ref 3.0–12.0)
Monocytes Absolute: 0.8 10*3/uL (ref 0.1–1.0)
Neutro Abs: 3.9 10*3/uL (ref 1.4–7.7)
Neutrophils Relative %: 57.3 % (ref 43.0–77.0)
Platelets: 307 10*3/uL (ref 150.0–400.0)
RBC: 4.9 Mil/uL (ref 3.87–5.11)
RDW: 13.6 % (ref 11.5–15.5)
WBC: 6.8 10*3/uL (ref 4.0–10.5)

## 2016-06-27 LAB — HEPATIC FUNCTION PANEL
ALBUMIN: 4.2 g/dL (ref 3.5–5.2)
ALT: 18 U/L (ref 0–35)
AST: 15 U/L (ref 0–37)
Alkaline Phosphatase: 104 U/L (ref 39–117)
Bilirubin, Direct: 0.1 mg/dL (ref 0.0–0.3)
TOTAL PROTEIN: 6.8 g/dL (ref 6.0–8.3)
Total Bilirubin: 0.4 mg/dL (ref 0.2–1.2)

## 2016-06-27 LAB — BASIC METABOLIC PANEL
BUN: 10 mg/dL (ref 6–23)
CALCIUM: 9.4 mg/dL (ref 8.4–10.5)
CO2: 26 meq/L (ref 19–32)
CREATININE: 0.81 mg/dL (ref 0.40–1.20)
Chloride: 108 mEq/L (ref 96–112)
GFR: 87.34 mL/min (ref >60.00)
GLUCOSE: 105 mg/dL — AB (ref 70–99)
Potassium: 4.1 mEq/L (ref 3.5–5.1)
SODIUM: 139 meq/L (ref 135–145)

## 2016-06-27 LAB — LIPASE: Lipase: 15 U/L (ref 11.0–59.0)

## 2016-06-27 LAB — C-REACTIVE PROTEIN: CRP: 0.3 mg/dL — ABNORMAL LOW (ref 0.5–20.0)

## 2016-06-27 LAB — SEDIMENTATION RATE: SED RATE: 3 mm/h (ref 0–20)

## 2016-06-27 LAB — TSH: TSH: 1.59 u[IU]/mL (ref 0.35–4.50)

## 2016-06-27 MED ORDER — GLYCOPYRROLATE 1 MG PO TABS: 1.0000 mg | ORAL_TABLET | Freq: Two times a day (BID) | ORAL | 11 refills | Status: DC

## 2016-06-27 NOTE — Patient Instructions (Signed)
Your physician has requested that you go to the basement for lab work before leaving today.  We have sent the following medications to your pharmacy for you to pick up at your convenience: robinul.   You have been given a low fodmap diet to follow.   Normal BMI (Body Mass Index- based on height and weight) is between 19 and 25. Your BMI today is Body mass index is 30.69 kg/m. Marland Kitchen Please consider follow up  regarding your BMI with your Primary Care Provider.   Thank you for choosing me and [facility].  Pricilla Riffle. Dagoberto Ligas., MD., Marval Regal

## 2016-06-27 NOTE — Progress Notes (Signed)
    History of Present Illness: This is a 31 year old female with 2-3 months of post prandial diarrhea, generalized abdominal pain and bloating. She had had chronic problems with recurrent N/V without a clear etiology noted. She has chronic headaches that may or may not be related to her N/V. She has not followed up with Neurology for her headaches. She denies medication changes, recent antibiotics, travel or diet changes. Denies weight loss,  constipation, change in stool caliber, melena, hematochezia, dysphagia, reflux symptoms, chest pain.  EGD 07/2013 ENDOSCOPIC IMPRESSION: 1. Small hiatal hernia 2. Gastritis in the gastric antrum and gastric body; multiple biopsies-mild chronic gastritis, no HP 3. Sessile polyp measuring 4 mm in size in the gastric antrum; biopsied-benign fundic gland polyp  Current Medications, Allergies, Past Medical History, Past Surgical History, Family History and Social History were reviewed in [employer] record.  Physical Exam: General: Well developed, well nourished, no acute distress Head: Normocephalic and atraumatic Eyes:  sclerae anicteric, EOMI Ears: Normal auditory acuity Mouth: No deformity or lesions Lungs: Clear throughout to auscultation Heart: Regular rate and rhythm; no murmurs, rubs or bruits Abdomen: Soft, generalized tenderness more so in upper abdomen and non distended. No masses, hepatosplenomegaly or hernias noted. Normal Bowel sounds Musculoskeletal: Symmetrical with no gross deformities  Pulses:  Normal pulses noted Extremities: No clubbing, cyanosis, edema or deformities noted Neurological: Alert oriented x 4, grossly nonfocal Psychological:  Alert and cooperative. Anxious.   Assessment and Recommendations:  1. 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 R/O infectious diarrhea, IBD. CMP, CBC, TSH, lipase, ESR, CRP, GI pathogen panel. Glycopyrrolate 1 mg po bid. Lactose free diet. Low FODMAP diet. REV in 4-6 week.   2. Cyclic N/V without a  GI etiology identified. Possibly stress or anxiety related. Recurrent headaches could be related to N/V. Recommend follow up with Neurology. Continue phenergan 12.5 mg to 25 mg tid prn.   3. GERD. Omeprazole 40 mg po bid.

## 2016-06-29 NOTE — Progress Notes (Unsigned)
Dr. Fuller Plan wanted to let you know your labs are all fine.  Please call or message if you have any questions

## 2016-07-21 ENCOUNTER — Ambulatory Visit (INDEPENDENT_AMBULATORY_CARE_PROVIDER_SITE_OTHER): Payer: Federal, State, Local not specified - PPO | Admitting: Family Medicine

## 2016-07-21 ENCOUNTER — Encounter: Payer: Self-pay | Admitting: Family Medicine

## 2016-07-21 VITALS — BP 118/80 | HR 89 | Temp 98.2°F | Ht 62.5 in | Wt 172.0 lb

## 2016-07-21 DIAGNOSIS — R1084 Generalized abdominal pain: Secondary | ICD-10-CM

## 2016-07-21 NOTE — Progress Notes (Signed)
   Subjective:    Patient ID: Karen Sanchez, female    DOB: Oct 07, 1985, 31 y.o.   MRN: [id number]  HPI abd pain- pt reports she is being treated for GI issues and since they got new equipment at work she is not able to use it.  She works at the [employer] and has to bend into large bins which causes 'severe pain and nausea'.  She filled out accomodation forms with work but they have not accepted these.  Pt is working 6 days/week.  Asking to be written out of work.   Review of Systems For ROS see HPI     Objective:   Physical Exam  Constitutional: She is oriented to person, place, and time. She appears well-developed and well-nourished. No distress.  HENT:  Head: Normocephalic and atraumatic.  Abdominal: Soft. Bowel sounds are normal. She exhibits no distension. There is tenderness (mild TTP diffusely). There is no rebound and no guarding.  Neurological: She is alert and oriented to person, place, and time.  Skin: Skin is warm and dry.  Psychiatric: She has a normal mood and affect. Her behavior is normal. Thought content normal.  Vitals reviewed.         Assessment & Plan:  abd pain- new to provider, ongoing for pt.  I told her that since I am not her regular physician, I cannot complete FMLA or work accommodations.  I am happy to write her out of work today so she can address this w/ her PCP on Monday.  She is not happy with this as she was told by her home office that she should be seen today for this.  Will route to PCP for f/u next week.

## 2016-07-21 NOTE — Progress Notes (Signed)
Pre visit review using our clinic review tool, if applicable. No additional management support is needed unless otherwise documented below in the visit note. 

## 2016-07-21 NOTE — Patient Instructions (Signed)
Follow up w/ your regular doctor on Monday Submit the work note for your time missed today GOOD LUCK!!!

## 2016-07-23 ENCOUNTER — Encounter: Payer: Self-pay | Admitting: Family Medicine

## 2016-07-23 ENCOUNTER — Ambulatory Visit (INDEPENDENT_AMBULATORY_CARE_PROVIDER_SITE_OTHER): Payer: Federal, State, Local not specified - PPO | Admitting: Family Medicine

## 2016-07-23 VITALS — BP 100/68 | HR 86 | Temp 98.0°F | Ht 63.75 in | Wt 171.8 lb

## 2016-07-23 DIAGNOSIS — K580 Irritable bowel syndrome with diarrhea: Secondary | ICD-10-CM

## 2016-07-23 DIAGNOSIS — K589 Irritable bowel syndrome without diarrhea: Secondary | ICD-10-CM | POA: Insufficient documentation

## 2016-07-23 DIAGNOSIS — K2950 Unspecified chronic gastritis without bleeding: Secondary | ICD-10-CM | POA: Diagnosis not present

## 2016-07-23 NOTE — Progress Notes (Signed)
Pre visit review using our clinic review tool, if applicable. No additional management support is needed unless otherwise documented below in the visit note. 

## 2016-07-23 NOTE — Patient Instructions (Signed)
I wrote a note for changes necessary for work   Continue current medicines Follow up with GI as planned  Do keep a journal of your diet for your next visit You may end up needing an endoscopy or gall bladder assessment  If symptoms suddenly worsen - alert [place]

## 2016-07-23 NOTE — Assessment & Plan Note (Signed)
Continue f/u with GI upcoming May need colonoscopy to r/o other disorders  Currently on fodmap diet -intol of fiber

## 2016-07-23 NOTE — Progress Notes (Signed)
Subjective:    Patient ID: Karen Sanchez, female    DOB: 01-07-1986, 31 y.o.   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  This hurts her abdomen and her back -it makes her symptoms much much worse  Makes her dry heave and vomit repeatedly   Seeing GI for her gastritis and IBS  Hurting all the time  ompreprazole 40 twice daily  robinol just started-not helping much yet   She is vomiting at least once per week Some heartburn in addition to gastritis    Last endoscopy was 3 y ago  Feels like her symptoms have become a lot worse   She is on a low fob diet -does not seem to help  No bread or dairy Low fiber and low fat   She needs different equipment to use      Wt Readings from Last 3 Encounters:  07/23/16 171 lb 12 oz (77.9 kg)  07/21/16 172 lb (78 kg)  06/27/16 170 lb 8 oz (77.3 kg)   bmi 29.7  Patient Active Problem List   Diagnosis Date Noted  . IBS (irritable bowel syndrome) 07/23/2016  . Gastritis 05/25/2016  . Joint pain 11/16/2015  . Right hand pain 11/16/2015  . Left knee pain 11/16/2015  . Plantar fasciitis 11/13/2015  . Depo contraception 07/12/2014  . Headache(784.0) 08/28/2013  . Routine general medical examination at a health care facility 03/11/2013  . Encounter for routine gynecological examination 03/11/2013  . 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 HYPOGLYCEMIA 07/12/2006   Past Medical History:  Diagnosis Date  . Anemia   . Anxiety   . Depression   . Fundic gland polyps of stomach, benign   . Hypoglycemia    Past Surgical History:  Procedure Laterality Date  . INDUCED ABORTION     age 50   Social History  Substance Use Topics  . Smoking status: Never Smoker  . Smokeless tobacco: Never Used  . Alcohol use No   Family History  Problem Relation Age of Onset  . Diabetes Mother   . Cancer Maternal Aunt     breast  . Cancer  Paternal Aunt     breast  . Diabetes Father   . Diabetes Maternal Grandmother     paternal grandmother  . Breast cancer Maternal Aunt    Allergies  Allergen Reactions  . Citric Acid     Vaginal irritation   Current Outpatient Prescriptions on File Prior to Visit  Medication Sig Dispense Refill  . desvenlafaxine (PRISTIQ) 100 MG 24 hr tablet Take 100 mg by mouth daily.    Marland Kitchen glycopyrrolate (ROBINUL) 1 MG tablet Take 1 tablet (1 mg total) by mouth 2 (two) times daily. 60 tablet 11  . medroxyPROGESTERone (DEPO-PROVERA) 150 MG/ML injection Inject 150 mg into the muscle every 3 (three) months.    Marland Kitchen omeprazole (PRILOSEC) 40 MG capsule TAKE 1 CAPSULE (40 MG TOTAL) BY MOUTH 2 (TWO) TIMES DAILY. 30 capsule 5  . promethazine (PHENERGAN) 25 MG tablet Take 1 tablet (25 mg total) by mouth every 8 (eight) hours as needed for nausea or vomiting. 20 tablet 1  . terconazole (TERAZOL 7) 0.4 % vaginal cream Place 1 applicator vaginally at bedtime. As needed for yeast infection 45 g 3  . traZODone (DESYREL) 50 MG tablet Take 1 tablet by mouth at bedtime as needed.  Current Facility-Administered Medications on File Prior to Visit  Medication Dose Route Frequency Provider Last Rate Last Dose  . medroxyPROGESTERone (DEPO-PROVERA) injection 150 mg  150 mg Intramuscular Q90 days Abner Greenspan, MD   150 mg at 02/16/16 1531     Review of Systems Review of Systems  Constitutional: Negative for fever, appetite change, fatigue and unexpected weight change.  Eyes: Negative for pain and visual disturbance.  Respiratory: Negative for cough and shortness of breath.   Cardiovascular: Negative for cp or palpitations    Gastrointestinal: Negative for constipation/ blood in stool or black stool, pos for abd cramping and heartburn Genitourinary: Negative for urgency and frequency.  Skin: Negative for pallor or rash   Neurological: Negative for weakness, light-headedness, numbness and headaches.  Hematological:  Negative for adenopathy. Does not bruise/bleed easily.  Psychiatric/Behavioral: Negative for dysphoric mood. The patient is not nervous/anxious.  pos for stressors        Objective:   Physical Exam  Constitutional: She appears well-developed and well-nourished. No distress.  overwt and well app  HENT:  Head: Normocephalic and atraumatic.  Mouth/Throat: Oropharynx is clear and moist.  Eyes: Conjunctivae and EOM are normal. Pupils are equal, round, and reactive to light. No scleral icterus.  Neck: Normal range of motion. Neck supple.  Cardiovascular: Normal rate, regular rhythm and normal heart sounds.   Pulmonary/Chest: Effort normal and breath sounds normal. No respiratory distress. She has no wheezes. She has no rales.  Abdominal: Soft. Bowel sounds are normal. She exhibits no distension, no pulsatile liver, no abdominal bruit, no pulsatile midline mass and no mass. There is no hepatosplenomegaly. There is generalized tenderness. There is positive Murphy's sign. There is no rigidity, no rebound, no guarding, no CVA tenderness and no tenderness at McBurney's point.  Abdomen is bloated w/o sounding tympanic  No HSM Diffusely tender   Lymphadenopathy:    She has no cervical adenopathy.  Neurological: She is alert. She displays no atrophy. No sensory deficit. She exhibits normal muscle tone.  Skin: Skin is warm and dry. No erythema. No pallor.  No jaundice  Psychiatric: She has a normal mood and affect.  Seems stressed           Assessment & Plan:   Problem List Items Addressed This Visit      Digestive   Gastritis - Primary    Worse recently with some gerd symptoms as well  On omeprazole 40 bid  Unable to bend over into deep mail bins at work or she will vomit and have pain  Note written for work to change her position until this gets rectified  Disc bland diet / also not over filling Will likely need EGD/also disc poss [place] of gb      IBS (irritable bowel syndrome)     Continue f/u with GI upcoming May need colonoscopy to r/o other disorders  Currently on fodmap diet -intol of fiber

## 2016-07-23 NOTE — Assessment & Plan Note (Signed)
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

## 2016-08-01 ENCOUNTER — Ambulatory Visit (INDEPENDENT_AMBULATORY_CARE_PROVIDER_SITE_OTHER): Payer: Federal, State, Local not specified - PPO | Admitting: Gastroenterology

## 2016-08-01 ENCOUNTER — Encounter: Payer: Self-pay | Admitting: Gastroenterology

## 2016-08-01 VITALS — BP 108/66 | HR 76 | Ht 63.0 in | Wt 174.0 lb

## 2016-08-01 DIAGNOSIS — K580 Irritable bowel syndrome with diarrhea: Secondary | ICD-10-CM

## 2016-08-01 DIAGNOSIS — K219 Gastro-esophageal reflux disease without esophagitis: Secondary | ICD-10-CM | POA: Diagnosis not present

## 2016-08-01 DIAGNOSIS — R1013 Epigastric pain: Secondary | ICD-10-CM | POA: Diagnosis not present

## 2016-08-01 DIAGNOSIS — R079 Chest pain, unspecified: Secondary | ICD-10-CM

## 2016-08-01 NOTE — Patient Instructions (Signed)
Take Tylenol three times a day for 7-10 days.   You have been scheduled for an abdominal ultrasound at [facility] (1st floor of hospital) on 08/09/16 at 8:30am. Please arrive 15 minutes prior to your appointment for registration. Make certain not to have anything to eat or drink 6 hours prior to your appointment. Should you need to reschedule your appointment, please contact radiology at [phone]. This test typically takes about 30 minutes to perform.  You have been scheduled for an endoscopy. Please follow written instructions given to you at your visit today. If you use inhalers (even only as needed), please bring them with you on the day of your procedure. Your physician has requested that you go to www.startemmi.com and enter the access code given to you at your visit today. This web site gives a general overview about your procedure. However, you should still follow specific instructions given to you by our office regarding your preparation for the procedure.  Normal BMI (Body Mass Index- based on height and weight) is between 19 and 25. Your BMI today is Body mass index is 30.82 kg/m. Marland Kitchen Please consider follow up  regarding your BMI with your Primary Care Provider.  Thank you for choosing me and [facility].  Pricilla Riffle. Dagoberto Ligas., MD., Marval Regal

## 2016-08-01 NOTE — Progress Notes (Signed)
    History of Present Illness: This is a 31 year old female with epigastric pain, GERD, IBS-D and recurrent nausea and vomiting. Diarrhea has improved. Epigastric pain, N/V persists. No GI etiology has been identified as causing her nausea and vomiting however she does have recurrent problems with headaches. She states her abdominal pain is exacerbated by movement, bending and lifting. She saw Dr. Birdie Riddle on 4/14 and then Dr. Glori Bickers on 4/16 requesting work restrictions as bending, lifting exacerbate back pain, abd pain.  Current Medications, Allergies, Past Medical History, Past Surgical History, Family History and Social History were reviewed in [employer] record.  Physical Exam: General: Well developed, well nourished, no acute distress Head: Normocephalic and atraumatic Eyes:  sclerae anicteric, EOMI Ears: Normal auditory acuity Mouth: No deformity or lesions Lungs: Clear throughout to auscultation. R > L lower chest wall tenderness Heart: Regular rate and rhythm; no murmurs, rubs or bruits Abdomen: Soft, mild epigastric tenderness and non distended. No masses, hepatosplenomegaly or hernias noted. Normal Bowel sounds Musculoskeletal: Symmetrical with no gross deformities  Pulses:  Normal pulses noted Extremities: No clubbing, cyanosis, edema or deformities noted Neurological: Alert oriented x 4, grossly nonfocal Psychological:  Alert and cooperative. Depressed affect  Assessment and Recommendations:  1. IBS-D, improved. Continue Robinul 1 mg po bid and low FODMAP diet.   2. Epigastric pain, lower chest wall pain, not improved. Suspect musculoskeletal as they are exacerbated by movement/bending/lifting and have not responded to PPIs and antispasmodics. Rule out cholelithiasis, GERD, ulcer. Schedule abdominal ultrasound and EGD. Begin Tylenol 1-2 extra strength 3 times a day for 7-10 days. The risks (including bleeding, perforation, infection, missed lesions,  medication reactions and possible hospitalization or surgery if complications occur), benefits, and alternatives to endoscopy with possible biopsy and possible dilation were discussed with the patient and they consent to proceed. If this found to be musculoskeletal she will return to her PCP for further management.  3. GERD. Continue omeprazole 40 mg twice daily and standard antireflux measures.   4. Recurrent nausea and vomiting. Rule out GERD or functional etiologies. EGD as above Continue Phenergan when necessary.

## 2016-08-02 ENCOUNTER — Ambulatory Visit: Payer: Federal, State, Local not specified - PPO

## 2016-08-09 ENCOUNTER — Other Ambulatory Visit: Payer: Self-pay

## 2016-08-09 ENCOUNTER — Ambulatory Visit (HOSPITAL_COMMUNITY)
Admission: RE | Admit: 2016-08-09 | Discharge: 2016-08-09 | Disposition: A | Discharge status: Home / Self Care | Payer: Federal, State, Local not specified - PPO | Source: Ambulatory Visit | Attending: Gastroenterology | Admitting: Gastroenterology

## 2016-08-09 DIAGNOSIS — R1013 Epigastric pain: Secondary | ICD-10-CM

## 2016-08-09 DIAGNOSIS — K769 Liver disease, unspecified: Secondary | ICD-10-CM | POA: Diagnosis not present

## 2016-08-09 DIAGNOSIS — K7689 Other specified diseases of liver: Secondary | ICD-10-CM | POA: Insufficient documentation

## 2016-08-09 DIAGNOSIS — K580 Irritable bowel syndrome with diarrhea: Secondary | ICD-10-CM | POA: Diagnosis not present

## 2016-08-09 DIAGNOSIS — R079 Chest pain, unspecified: Secondary | ICD-10-CM | POA: Diagnosis not present

## 2016-08-09 DIAGNOSIS — K219 Gastro-esophageal reflux disease without esophagitis: Secondary | ICD-10-CM

## 2016-08-13 ENCOUNTER — Encounter: Payer: Self-pay | Admitting: Gastroenterology

## 2016-08-16 ENCOUNTER — Ambulatory Visit (HOSPITAL_COMMUNITY)
Admission: RE | Admit: 2016-08-16 | Discharge: 2016-08-16 | Disposition: A | Discharge status: Home / Self Care | Payer: Federal, State, Local not specified - PPO | Source: Ambulatory Visit | Attending: Internal Medicine | Admitting: Internal Medicine

## 2016-08-16 DIAGNOSIS — K769 Liver disease, unspecified: Secondary | ICD-10-CM | POA: Diagnosis not present

## 2016-08-16 DIAGNOSIS — R109 Unspecified abdominal pain: Secondary | ICD-10-CM | POA: Diagnosis not present

## 2016-08-16 DIAGNOSIS — K7689 Other specified diseases of liver: Secondary | ICD-10-CM | POA: Diagnosis not present

## 2016-08-16 DIAGNOSIS — N6310 Unspecified lump in the right breast, unspecified quadrant: Secondary | ICD-10-CM | POA: Insufficient documentation

## 2016-08-16 MED ORDER — GADOXETATE DISODIUM 0.25 MMOL/ML IV SOLN
10.0000 mL | Freq: Once | INTRAVENOUS | Status: AC | PRN
  Administered 2016-08-16: 8 mL via INTRAVENOUS

## 2016-08-16 NOTE — Progress Notes (Signed)
Sending to Dr. Fuller Plan - he ordered this but my name was attached  Liver lesion thought hemangioma but there is an incidental breast mass

## 2016-08-27 ENCOUNTER — Ambulatory Visit (AMBULATORY_SURGERY_CENTER): Payer: Federal, State, Local not specified - PPO | Admitting: Gastroenterology

## 2016-08-27 VITALS — BP 105/69 | HR 68 | Temp 98.0°F | Resp 18 | Ht 63.0 in | Wt 174.0 lb

## 2016-08-27 DIAGNOSIS — R1013 Epigastric pain: Secondary | ICD-10-CM | POA: Diagnosis present

## 2016-08-27 DIAGNOSIS — K317 Polyp of stomach and duodenum: Secondary | ICD-10-CM

## 2016-08-27 DIAGNOSIS — R079 Chest pain, unspecified: Secondary | ICD-10-CM

## 2016-08-27 DIAGNOSIS — R112 Nausea with vomiting, unspecified: Secondary | ICD-10-CM | POA: Diagnosis not present

## 2016-08-27 DIAGNOSIS — K219 Gastro-esophageal reflux disease without esophagitis: Secondary | ICD-10-CM

## 2016-08-27 MED ORDER — SODIUM CHLORIDE 0.9 % IV SOLN: 500.0000 mL | INTRAVENOUS | Status: DC

## 2016-08-27 NOTE — Progress Notes (Signed)
Pt's states no medical or surgical changes since previsit or office visit. 

## 2016-08-27 NOTE — Patient Instructions (Signed)
Impression/Recommendations:  Resume previous diet. Continue present medications. Await pathology results.  YOU HAD AN ENDOSCOPIC PROCEDURE TODAY AT THE [facility]:   Refer to the procedure report that was given to you for any specific questions about what was found during the examination.  If the procedure report does not answer your questions, please call your gastroenterologist to clarify.  If you requested that your care partner not be given the details of your procedure findings, then the procedure report has been included in a sealed envelope for you to review at your convenience later.  YOU SHOULD EXPECT: Some feelings of bloating in the abdomen. Passage of more gas than usual.  Walking can help get rid of the air that was put into your GI tract during the procedure and reduce the bloating. If you had a lower endoscopy (such as a colonoscopy or flexible sigmoidoscopy) you may notice spotting of blood in your stool or on the toilet paper. If you underwent a bowel prep for your procedure, you may not have a normal bowel movement for a few days.  Please Note:  You might notice some irritation and congestion in your nose or some drainage.  This is from the oxygen used during your procedure.  There is no need for concern and it should clear up in a day or so.  SYMPTOMS TO REPORT IMMEDIATELY:   Following upper endoscopy (EGD)  Vomiting of blood or coffee ground material  New chest pain or pain under the shoulder blades  Painful or persistently difficult swallowing  New shortness of breath  Fever of 100F or higher  Black, tarry-looking stools  For urgent or emergent issues, a gastroenterologist can be reached at any hour by calling [phone].   DIET:  We do recommend a small meal at first, but then you may proceed to your regular diet.  Drink plenty of fluids but you should avoid alcoholic beverages for 24 hours.  ACTIVITY:  You should plan to take it easy for the rest  of today and you should NOT DRIVE or use heavy machinery until tomorrow (because of the sedation medicines used during the test).    FOLLOW UP: Our staff will call the number listed on your records the next business day following your procedure to check on you and address any questions or concerns that you may have regarding the information given to you following your procedure. If we do not reach you, we will leave a message.  However, if you are feeling well and you are not experiencing any problems, there is no need to return our call.  We will assume that you have returned to your regular daily activities without incident.  If any biopsies were taken you will be contacted by phone or by letter within the next 1-3 weeks.  Please call us at [phone] if you have not heard about the biopsies in 3 weeks.    SIGNATURES/CONFIDENTIALITY: You and/or your care partner have signed paperwork which will be entered into your electronic medical record.  These signatures attest to the fact that that the information above on your After Visit Summary has been reviewed and is understood.  Full responsibility of the confidentiality of this discharge information lies with you and/or your care-partner. 

## 2016-08-27 NOTE — Op Note (Signed)
[facility] Patient Name: Karen Sanchez Procedure Date: 08/27/2016 10:01 AM MRN: [id number] Endoscopist: Ladene Artist , MD Age: 31 Referring MD:  Date of Birth: 12-29-1985 Gender: Female Account #: [id number] Procedure:                Upper GI endoscopy Indications:              Epigastric abdominal pain, Gastro-esophageal reflux                            disease, Unexplained chest pain, Nausea with                            vomiting Medicines:                Monitored Anesthesia Care Procedure:                Pre-Anesthesia Assessment:                           - Prior to the procedure, a History and Physical                            was performed, and patient medications and                            allergies were reviewed. The patient's tolerance of                            previous anesthesia was also reviewed. The risks                            and benefits of the procedure and the sedation                            options and risks were discussed with the patient.                            All questions were answered, and informed consent                            was obtained. Prior Anticoagulants: The patient has                            taken no previous anticoagulant or antiplatelet                            agents. ASA Grade Assessment: II - A patient with                            mild systemic disease. After reviewing the risks                            and benefits, the patient was deemed in  satisfactory condition to undergo the procedure.                           After obtaining informed consent, the endoscope was                            passed under direct vision. Throughout the                            procedure, the patient's blood pressure, pulse, and                            oxygen saturations were monitored continuously. The                            Endoscope was introduced through the mouth,  and                            advanced to the second part of duodenum. The upper                            GI endoscopy was accomplished without difficulty.                            The patient tolerated the procedure well. Scope In: Scope Out: Findings:                 The examined esophagus was normal.                           A few 3 to 4 mm sessile polyps with no bleeding and                            no stigmata of recent bleeding were found in the                            stomach. Biopsies were taken with a cold forceps                            for histology.                           The exam was otherwise without abnormality.                           The duodenal bulb and second portion of the                            duodenum were normal. Complications:            No immediate complications. Estimated Blood Loss:     Estimated blood loss was minimal. Impression:               - Normal esophagus.                           -  A few gastric polyps. Biopsied.                           - The examination was otherwise normal.                           - Normal duodenal bulb and second portion of the                            duodenum. Recommendation:           - Patient has a contact number available for                            emergencies. The signs and symptoms of potential                            delayed complications were discussed with the                            patient. Return to normal activities tomorrow.                            Written discharge instructions were provided to the                            patient.                           - Resume previous diet.                           - Continue present medications.                           - Await pathology results. Ladene Artist, MD 08/27/2016 10:21:13 AM This report has been signed electronically.

## 2016-08-27 NOTE — Progress Notes (Signed)
Report given to PACU, vss 

## 2016-08-28 ENCOUNTER — Telehealth: Payer: Self-pay | Admitting: *Deleted

## 2016-08-28 ENCOUNTER — Telehealth: Payer: Self-pay

## 2016-08-28 NOTE — Telephone Encounter (Signed)
Message left

## 2016-08-28 NOTE — Telephone Encounter (Signed)
Left a message at [phone] for the pt.  Follow up call. maw

## 2016-09-03 ENCOUNTER — Encounter: Payer: Self-pay | Admitting: Gastroenterology

## 2016-12-22 DIAGNOSIS — R110 Nausea: Secondary | ICD-10-CM | POA: Diagnosis not present

## 2016-12-27 ENCOUNTER — Ambulatory Visit: Payer: Federal, State, Local not specified - PPO | Admitting: Family Medicine

## 2016-12-27 DIAGNOSIS — Z0289 Encounter for other administrative examinations: Secondary | ICD-10-CM

## 2016-12-27 DIAGNOSIS — F4323 Adjustment disorder with mixed anxiety and depressed mood: Secondary | ICD-10-CM | POA: Diagnosis not present

## 2017-01-10 DIAGNOSIS — F4310 Post-traumatic stress disorder, unspecified: Secondary | ICD-10-CM | POA: Diagnosis not present

## 2017-01-10 DIAGNOSIS — F410 Panic disorder [episodic paroxysmal anxiety] without agoraphobia: Secondary | ICD-10-CM | POA: Diagnosis not present

## 2017-02-12 ENCOUNTER — Ambulatory Visit: Payer: Federal, State, Local not specified - PPO | Admitting: Family Medicine

## 2017-02-21 ENCOUNTER — Ambulatory Visit: Payer: Federal, State, Local not specified - PPO | Admitting: Family Medicine

## 2017-02-21 ENCOUNTER — Encounter: Payer: Self-pay | Admitting: Family Medicine

## 2017-02-21 VITALS — BP 100/70 | HR 84 | Temp 98.4°F | Ht 63.75 in | Wt 174.0 lb

## 2017-02-21 DIAGNOSIS — Z23 Encounter for immunization: Secondary | ICD-10-CM | POA: Diagnosis not present

## 2017-02-21 DIAGNOSIS — L918 Other hypertrophic disorders of the skin: Secondary | ICD-10-CM

## 2017-02-21 DIAGNOSIS — Z30013 Encounter for initial prescription of injectable contraceptive: Secondary | ICD-10-CM

## 2017-02-21 DIAGNOSIS — F4323 Adjustment disorder with mixed anxiety and depressed mood: Secondary | ICD-10-CM | POA: Diagnosis not present

## 2017-02-21 DIAGNOSIS — Z3042 Encounter for surveillance of injectable contraceptive: Secondary | ICD-10-CM

## 2017-02-21 LAB — POCT URINE PREGNANCY: PREG TEST UR: NEGATIVE

## 2017-02-21 MED ORDER — MEDROXYPROGESTERONE ACETATE 150 MG/ML IM SUSP: 150.0000 mg | Freq: Once | INTRAMUSCULAR | Status: DC

## 2017-02-21 MED ORDER — MEDROXYPROGESTERONE ACETATE 150 MG/ML IM SUSP
150.0000 mg | Freq: Once | INTRAMUSCULAR | Status: AC
  Administered 2017-02-21: 150 mg via INTRAMUSCULAR

## 2017-02-21 NOTE — Progress Notes (Signed)
   Subjective:    Patient ID: Karen Sanchez, female    DOB: 1986/03/16, 31 y.o.   MRN: [id number].   HPI    31 year old female presents for initiation of depo.  She has been on it for 10 years but got off in 07/2016.  She wanted to restart now.  No SE on depo in past.. Caused menses to stop   She  Has neg Urine preg today.  Last menses 2 weeks ago.   Given tdap and  Flu   Skin tags on neck under left arm and at right eyelid. Sore form rubbing on left axillae and under eye. Catches with shaving.  Bleeding off and on.   Tolerated crypotherapy before and wishes to repeat.       Review of Systems  Constitutional: Negative for fatigue and fever.  HENT: Negative for congestion.   Eyes: Negative for pain.  Respiratory: Negative for cough and shortness of breath.   Cardiovascular: Negative for chest pain, palpitations and leg swelling.  Gastrointestinal: Negative for abdominal pain.  Genitourinary: Negative for dysuria and vaginal bleeding.  Musculoskeletal: Negative for back pain.  Neurological: Negative for syncope, light-headedness and headaches.  Psychiatric/Behavioral: Negative for dysphoric mood.       Objective:   Physical Exam  Constitutional: Vital signs are normal. She appears well-developed and well-nourished. She is cooperative.  Non-toxic appearance. She does not appear ill. No distress.  HENT:  Head: Normocephalic.  Right Ear: Hearing, tympanic membrane, external ear and ear canal normal. Tympanic membrane is not erythematous, not retracted and not bulging.  Left Ear: Hearing, tympanic membrane, external ear and ear canal normal. Tympanic membrane is not erythematous, not retracted and not bulging.  Nose: No mucosal edema or rhinorrhea. Right sinus exhibits no maxillary sinus tenderness and no frontal sinus tenderness. Left sinus exhibits no maxillary sinus tenderness and no frontal sinus tenderness.  Mouth/Throat: Uvula is midline, oropharynx is clear and  moist and mucous membranes are normal.  Eyes: Conjunctivae, EOM and lids are normal. Pupils are equal, round, and reactive to light. Lids are everted and swept, no foreign bodies found.  Neck: Trachea normal and normal range of motion. Neck supple. Carotid bruit is not present. No thyroid mass and no thyromegaly present.  Cardiovascular: Normal rate, regular rhythm, S1 normal, S2 normal, normal heart sounds, intact distal pulses and normal pulses. Exam reveals no gallop and no friction rub.  No murmur heard. Pulmonary/Chest: Effort normal and breath sounds normal. No tachypnea. No respiratory distress. She has no decreased breath sounds. She has no wheezes. She has no rhonchi. She has no rales.  Abdominal: Soft. Normal appearance and bowel sounds are normal. There is no tenderness.  Neurological: She is alert.  Skin: Skin is warm, dry and intact. No rash noted.  mulitple skin tags.. On eyelid, right  and  Left axillae.. Irritated and erythematous  Psychiatric: Her speech is normal and behavior is normal. Judgment and thought content normal. Her mood appears not anxious. Cognition and memory are normal. She does not exhibit a depressed mood.          Assessment & Plan:  Procedure: Cryotherapy treatment of skin tags 3 cycles with 1 mm halo. No complications no bleeding.

## 2017-03-11 ENCOUNTER — Encounter: Payer: Self-pay | Admitting: Family Medicine

## 2017-03-11 DIAGNOSIS — Z30013 Encounter for initial prescription of injectable contraceptive: Secondary | ICD-10-CM

## 2017-03-11 HISTORY — DX: Encounter for initial prescription of injectable contraceptive: Z30.013

## 2017-03-11 NOTE — Assessment & Plan Note (Signed)
Counseled on options and started back on Depo.

## 2017-05-14 ENCOUNTER — Ambulatory Visit (INDEPENDENT_AMBULATORY_CARE_PROVIDER_SITE_OTHER): Payer: Federal, State, Local not specified - PPO

## 2017-05-14 DIAGNOSIS — Z3042 Encounter for surveillance of injectable contraceptive: Secondary | ICD-10-CM | POA: Diagnosis not present

## 2017-05-14 MED ORDER — MEDROXYPROGESTERONE ACETATE 150 MG/ML IM SUSY
PREFILLED_SYRINGE | Freq: Once | INTRAMUSCULAR | Status: AC
  Administered 2017-05-14: 15:00:00 via INTRAMUSCULAR

## 2017-06-06 DIAGNOSIS — F4323 Adjustment disorder with mixed anxiety and depressed mood: Secondary | ICD-10-CM | POA: Diagnosis not present

## 2017-06-25 ENCOUNTER — Encounter: Payer: Self-pay | Admitting: Family Medicine

## 2017-06-25 ENCOUNTER — Ambulatory Visit: Payer: Federal, State, Local not specified - PPO | Admitting: Family Medicine

## 2017-06-25 DIAGNOSIS — M546 Pain in thoracic spine: Secondary | ICD-10-CM

## 2017-06-25 MED ORDER — METHOCARBAMOL 500 MG PO TABS: 500.0000 mg | ORAL_TABLET | Freq: Three times a day (TID) | ORAL | 1 refills | Status: DC | PRN

## 2017-06-25 NOTE — Assessment & Plan Note (Signed)
Right sided/ medial to scapula  Suspect muscle spasm/strain  Disc stretching-taught rhomboid stretch which did target the area  Adv use of heat  PT handout  Robaxin generic prn with warning of sedation  Deep breaths to prevent atelectasis  Consider PT and imaging if no improvement

## 2017-06-25 NOTE — Progress Notes (Signed)
Subjective:    Patient ID: Karen Sanchez, female    DOB: October 04, 1985, 32 y.o.   MRN: [id number]  HPI Here for back pain - ? 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 Thoracic back pain 06/25/2017  . IBS (irritable bowel syndrome) 07/23/2016  . Gastritis 05/25/2016  . Joint pain 11/16/2015  . Right hand pain 11/16/2015  . Left knee pain 11/16/2015  . Plantar fasciitis 11/13/2015  . Depot contraception 07/12/2014  . Headache(784.0) 08/28/2013  . Routine general medical examination at a health care facility 03/11/2013  . Encounter for routine gynecological examination 03/11/2013  . 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 HYPOGLYCEMIA 07/12/2006   Past Medical History:  Diagnosis Date  . Anemia   . Anxiety   . Depression   . Encounter for initial prescription of injectable contraceptive 03/11/2017  . Fundic gland polyps of stomach, benign   . Hypoglycemia    Past Surgical History:  Procedure Laterality Date  . INDUCED ABORTION     age 39   Social History   Tobacco Use  . Smoking status: Never Smoker  . Smokeless tobacco: Never Used  Substance Use Topics  . Alcohol use: No    Alcohol/week: 0.0 oz  . Drug use: No   Family History  Problem  Relation Age of Onset  . Diabetes Mother   . Cancer Maternal Aunt        breast  . Cancer Paternal Aunt        breast  . Diabetes Father   . Diabetes Maternal Grandmother        paternal grandmother  . Breast cancer Maternal Aunt    Allergies  Allergen Reactions  . Citric Acid     Vaginal irritation   Current Outpatient Medications on File Prior to Visit  Medication Sig Dispense Refill  . desvenlafaxine (PRISTIQ) 100 MG 24 hr tablet Take 100 mg by mouth daily.    Marland Kitchen omeprazole (PRILOSEC) 40 MG capsule TAKE 1 CAPSULE (40 MG TOTAL) BY MOUTH 2 (TWO) TIMES DAILY. 30 capsule 5  . promethazine (PHENERGAN) 25 MG tablet Take 1 tablet (25 mg total) by mouth every 8 (eight) hours as needed for nausea or vomiting. 20 tablet 1  . traZODone (DESYREL) 50 MG tablet Take 1 tablet by mouth at bedtime as needed.     Current Facility-Administered Medications on File Prior to Visit  Medication Dose Route Frequency Provider Last Rate Last Dose  . 0.9 %  sodium chloride infusion  500 mL Intravenous Continuous Ladene Artist, MD  Review of Systems  Constitutional: Negative for activity change, appetite change, fatigue, fever and unexpected weight change.  HENT: Negative for congestion, ear pain, rhinorrhea, sinus pressure and sore throat.   Eyes: Negative for pain, redness and visual disturbance.  Respiratory: Negative for cough, shortness of breath and wheezing.   Cardiovascular: Negative for chest pain and palpitations.  Gastrointestinal: Negative for abdominal pain, blood in stool, constipation and diarrhea.  Endocrine: Negative for polydipsia and polyuria.  Genitourinary: Negative for dysuria, frequency and urgency.  Musculoskeletal: Positive for back pain. Negative for arthralgias, joint swelling and myalgias.  Skin: Negative for pallor and rash.  Allergic/Immunologic: Negative for environmental allergies.  Neurological: Negative for dizziness, syncope, light-headedness, numbness and  headaches.  Hematological: Negative for adenopathy. Does not bruise/bleed easily.  Psychiatric/Behavioral: Negative for decreased concentration and dysphoric mood. The patient is not nervous/anxious.        Objective:   Physical Exam  Constitutional: She appears well-developed and well-nourished. No distress.  HENT:  Head: Normocephalic and atraumatic.  Eyes: Conjunctivae and EOM are normal. Pupils are equal, round, and reactive to light. No scleral icterus.  Neck: Normal range of motion. Neck supple.  Cardiovascular: Normal rate and regular rhythm.  Pulmonary/Chest: Effort normal and breath sounds normal. She has no wheezes. She has no rales.  Abdominal: Soft. Bowel sounds are normal. She exhibits no distension. There is no tenderness.  Musculoskeletal: She exhibits tenderness.       Thoracic back: She exhibits decreased range of motion, tenderness and spasm. She exhibits no bony tenderness, no edema and no deformity.       Lumbar back: She exhibits decreased range of motion, tenderness and spasm. She exhibits no bony tenderness and no edema.  Thoracic musculature on R is tight and tender  Notable spasm in rhomboid area  No skin changes or rash  Nl rom of UEs Pain on flex/ext and twisting  No neuro changes   Lymphadenopathy:    She has no cervical adenopathy.  Neurological: She is alert. She has normal strength and normal reflexes. She displays no atrophy and no tremor. No cranial nerve deficit or sensory deficit. She exhibits normal muscle tone. Coordination and gait normal.  Negative SLR  Skin: Skin is warm and dry. No rash noted. No erythema. No pallor.  Psychiatric: She has a normal mood and affect.          Assessment & Plan:   Problem List Items Addressed This Visit      Other   Thoracic back pain    Right sided/ medial to scapula  Suspect muscle spasm/strain  Disc stretching-taught rhomboid stretch which did target the area  Adv use of heat  PT handout  Robaxin  generic prn with warning of sedation  Deep breaths to prevent atelectasis  Consider PT and imaging if no improvement        Relevant Medications   methocarbamol (ROBAXIN) 500 MG tablet

## 2017-06-25 NOTE — Patient Instructions (Signed)
I think you have some back spasm  Use heat  Keep walking  Do not twist and bend at the same time Take a good deep breath every 30 minutes   Use the robaxin /generic as needed  Tylenol is ok as well   Stretching is good- the Rhomboid stretch we reviewed  Massage helps also  If not improving within the next week-please call so we can set you up with physical therapy

## 2017-07-30 ENCOUNTER — Ambulatory Visit (INDEPENDENT_AMBULATORY_CARE_PROVIDER_SITE_OTHER): Payer: Federal, State, Local not specified - PPO

## 2017-07-30 DIAGNOSIS — Z3042 Encounter for surveillance of injectable contraceptive: Secondary | ICD-10-CM | POA: Diagnosis not present

## 2017-07-30 MED ORDER — MEDROXYPROGESTERONE ACETATE 150 MG/ML IM SUSY
PREFILLED_SYRINGE | Freq: Once | INTRAMUSCULAR | Status: AC
Start: 2017-07-30 — End: 2017-07-30
  Administered 2017-07-30: 16:00:00 via INTRAMUSCULAR

## 2017-09-29 IMAGING — US US ABDOMEN COMPLETE
1 series · 14 of 25 positions shown · non-contrast
Comparison: 08/13/2013

CLINICAL DATA: Abdominal pain for 1 year.

EXAM:
ABDOMEN ULTRASOUND COMPLETE

[Series 1: us abdomen complete · 0.20mm/px · 14 of 98 slices shown]
[im 1/98]
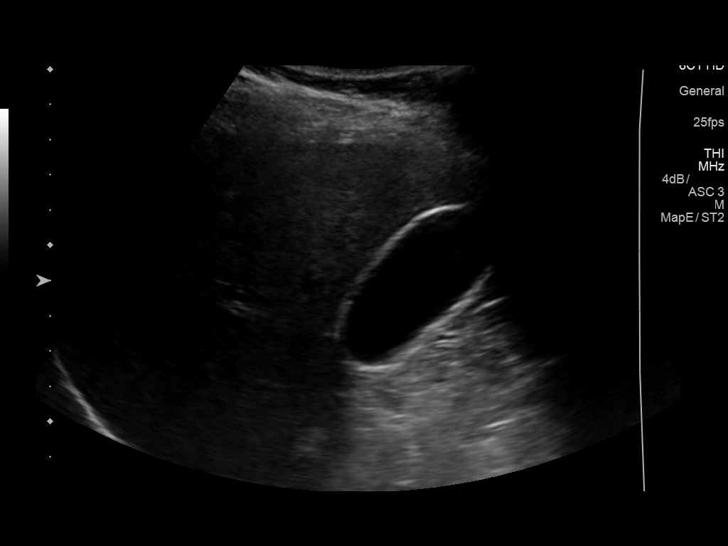
[im 9/98]
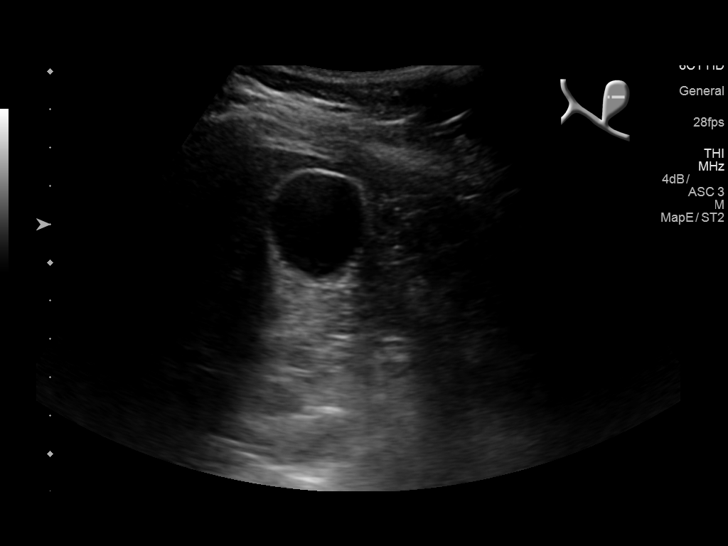
[im 17/98]
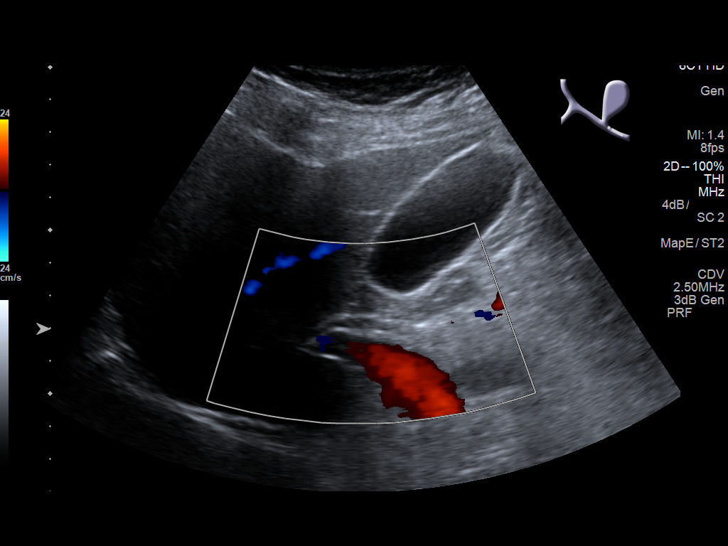
[im 25/98]
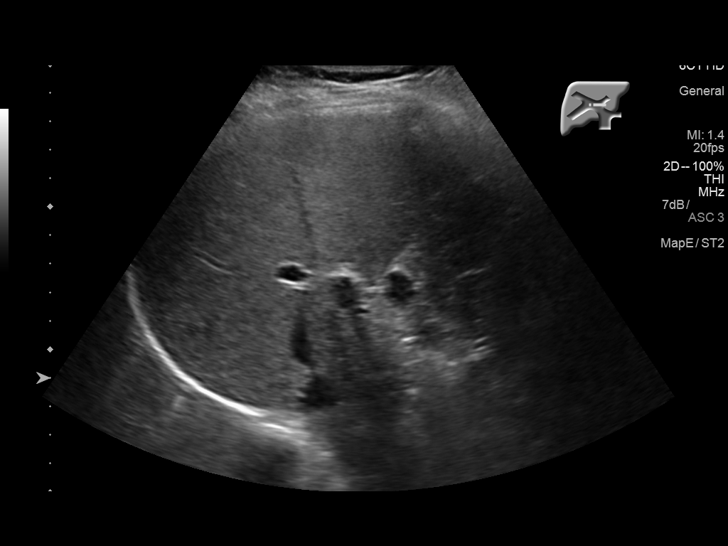
[im 33/98]
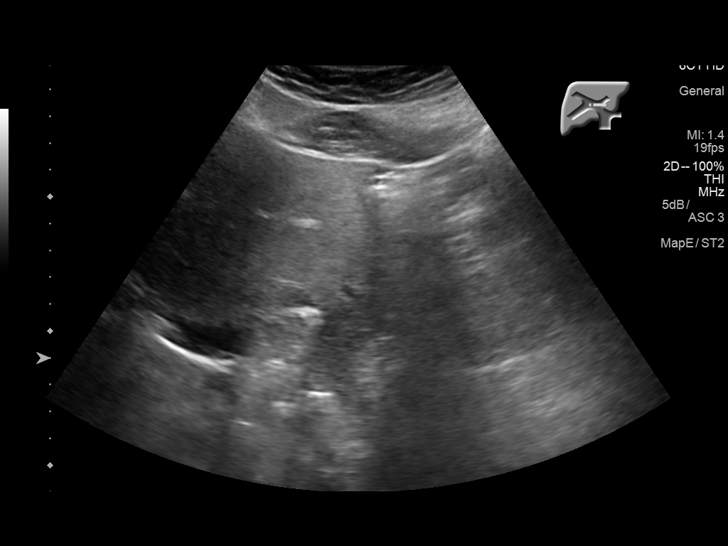
[im 37/98]
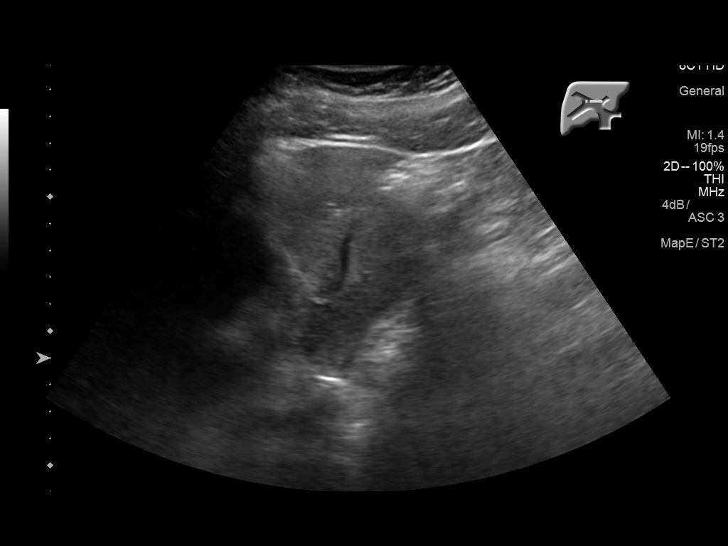
[im 45/98]
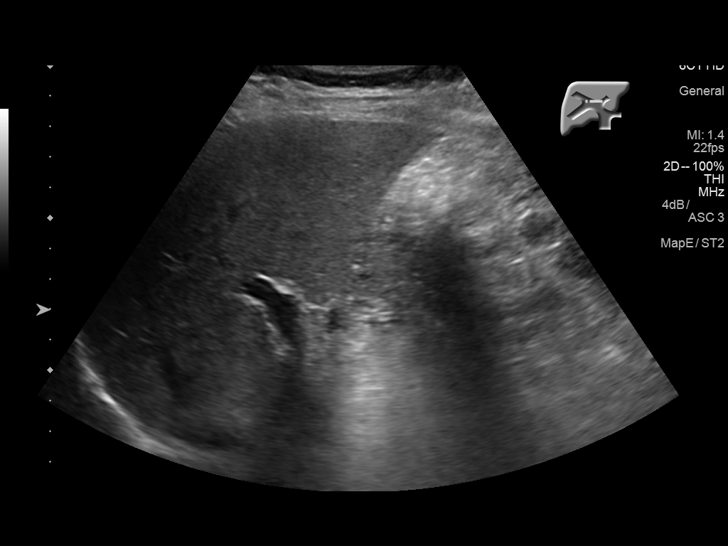
[im 53/98]
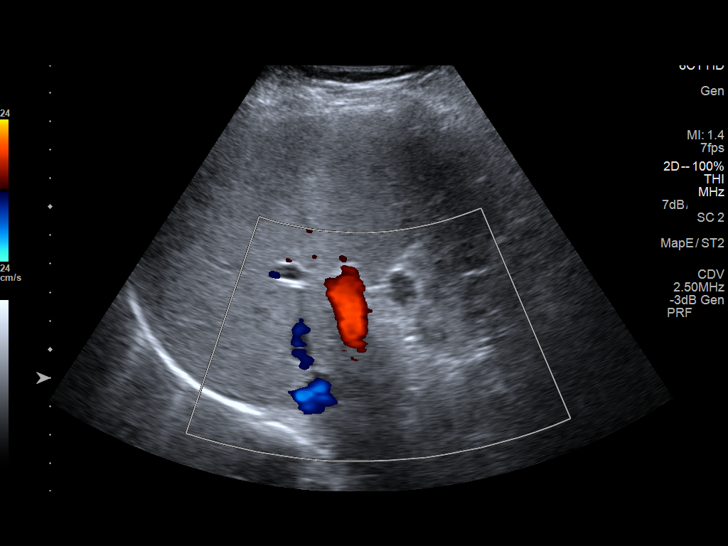
[im 61/98]
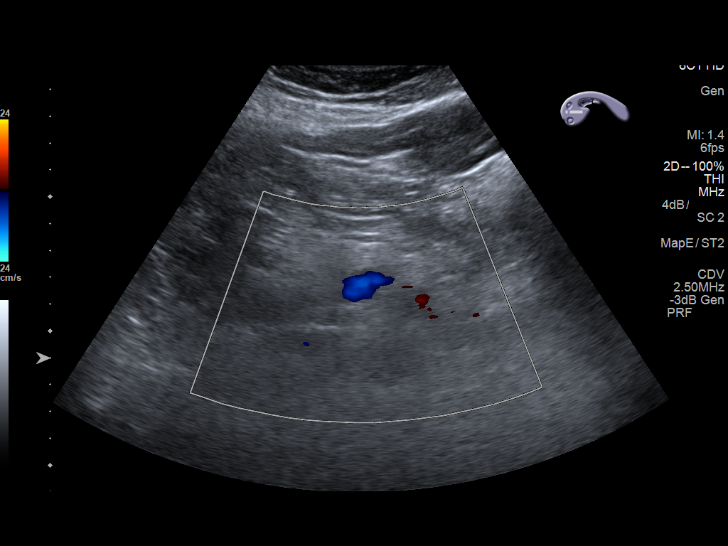
[im 65/98]
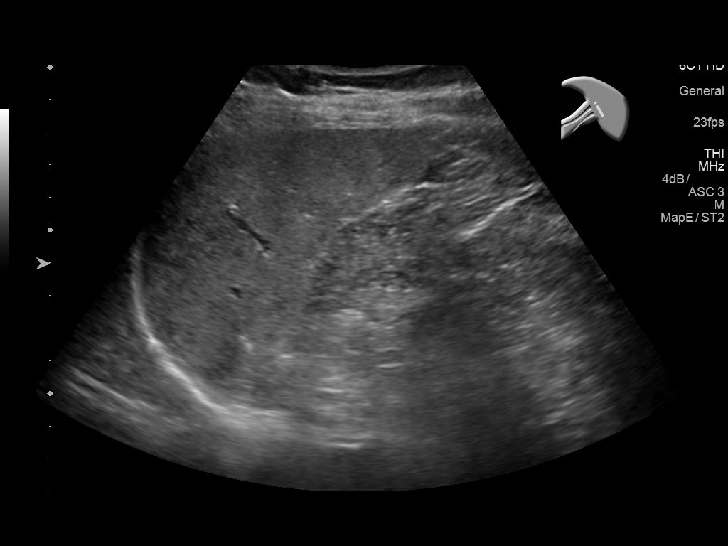
[im 73/98]
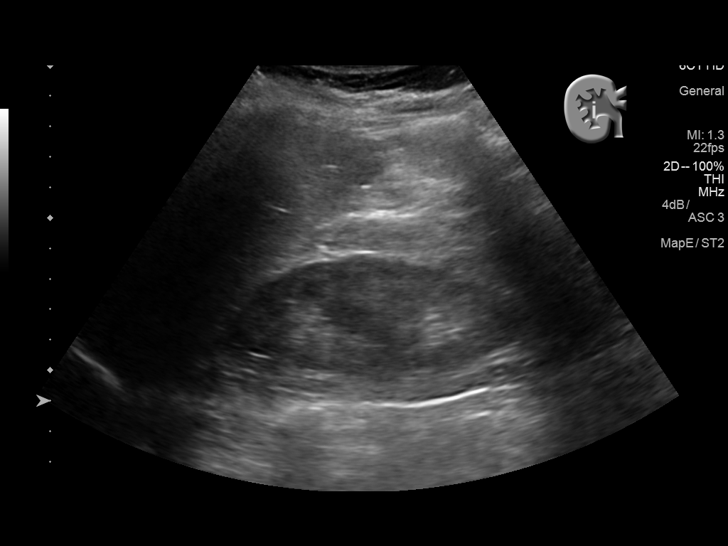
[im 81/98]
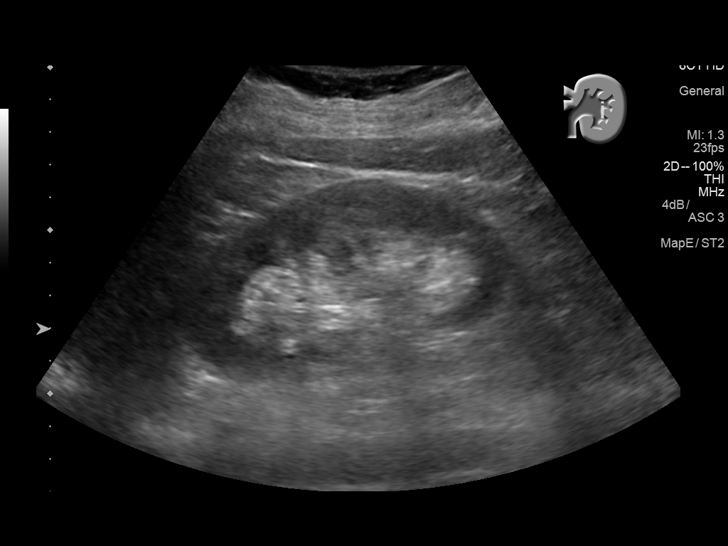
[im 89/98]
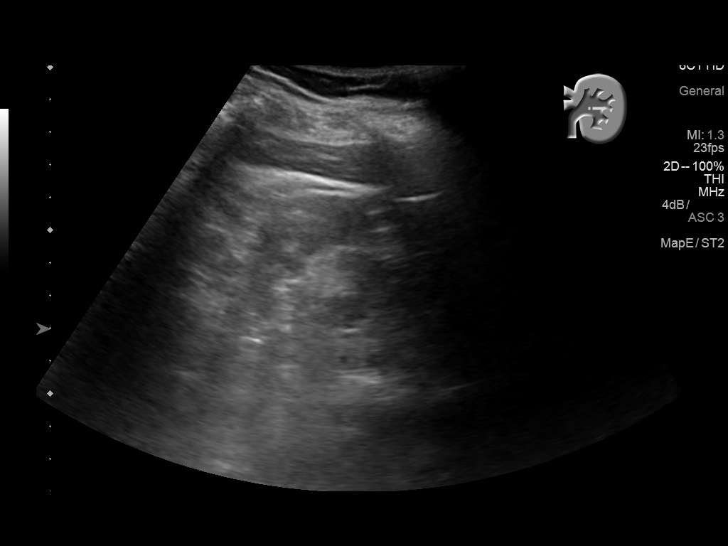
[im 98/98]
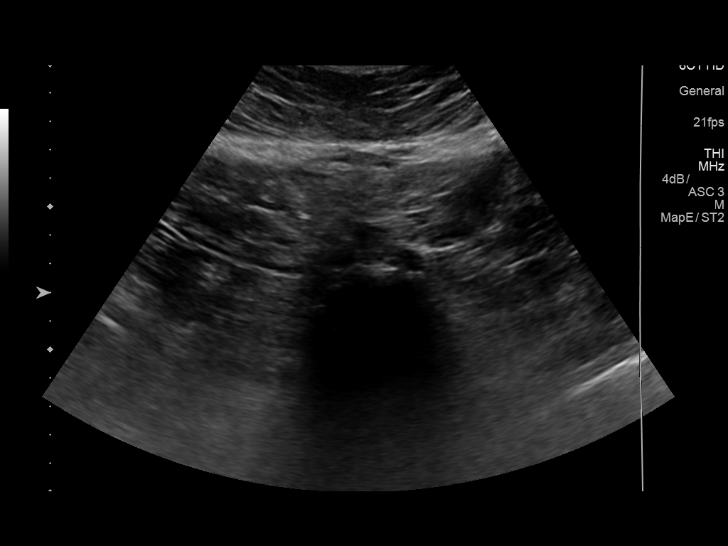

[14 of 25 positions shown; findings below may reference images not displayed]

FINDINGS: Gallbladder: Gallbladder has a normal appearance. Gallbladder wall
is 1.5 mm, within normal limits. No stones or pericholecystic fluid.
No sonographic Murphy's sign.

Common bile duct: Diameter: 3.3 mm

Liver: Normal parenchymal echogenicity. Within upper portion of the
right hepatic lobe there is a the homogeneous hyperechoic lesion
measuring 2.6 x 2.6 x 2.1 cm.

IVC: No abnormality visualized.

Pancreas: Visualized portion unremarkable.

Spleen: Size and appearance within normal limits.

Right Kidney: Length: 10.8 cm. Echogenicity within normal limits. No
mass or hydronephrosis visualized.

Left Kidney: Length: 10.4 cm. Echogenicity within normal limits. No
mass or hydronephrosis visualized.

Abdominal aorta: 2.0 cm

Other findings: None.
IMPRESSION: 1. 2.6 cm hyperechoic lesion in the right hepatic lobe.
Considerations include hemangioma, focal nodular hyperplasia, or
adenoma. Less commonly, metastases can have this appearance.
2. Further characterization with MRI is recommended.
3. No hydronephrosis or evidence for acute cholecystitis.

## 2017-10-16 ENCOUNTER — Ambulatory Visit (INDEPENDENT_AMBULATORY_CARE_PROVIDER_SITE_OTHER): Payer: Federal, State, Local not specified - PPO

## 2017-10-16 DIAGNOSIS — Z3042 Encounter for surveillance of injectable contraceptive: Secondary | ICD-10-CM | POA: Diagnosis not present

## 2017-10-16 MED ORDER — MEDROXYPROGESTERONE ACETATE 150 MG/ML IM SUSY
PREFILLED_SYRINGE | Freq: Once | INTRAMUSCULAR | Status: AC
  Administered 2017-10-16: 16:00:00 via INTRAMUSCULAR

## 2017-10-16 NOTE — Progress Notes (Signed)
Medroxyprogesterone Acetate Injectable Suspension, USP 150mg /ml given. Next shot due Sept 25 to Oct 9

## 2017-11-13 DIAGNOSIS — F4323 Adjustment disorder with mixed anxiety and depressed mood: Secondary | ICD-10-CM | POA: Diagnosis not present

## 2017-12-05 ENCOUNTER — Ambulatory Visit: Payer: Federal, State, Local not specified - PPO | Admitting: Family Medicine

## 2017-12-05 ENCOUNTER — Encounter: Payer: Self-pay | Admitting: Family Medicine

## 2017-12-05 VITALS — BP 106/74 | HR 70 | Temp 98.1°F | Ht 63.75 in | Wt 171.8 lb

## 2017-12-05 DIAGNOSIS — H6592 Unspecified nonsuppurative otitis media, left ear: Secondary | ICD-10-CM | POA: Diagnosis not present

## 2017-12-05 DIAGNOSIS — R1013 Epigastric pain: Secondary | ICD-10-CM

## 2017-12-05 DIAGNOSIS — J069 Acute upper respiratory infection, unspecified: Secondary | ICD-10-CM

## 2017-12-05 DIAGNOSIS — R112 Nausea with vomiting, unspecified: Secondary | ICD-10-CM | POA: Diagnosis not present

## 2017-12-05 DIAGNOSIS — K2950 Unspecified chronic gastritis without bleeding: Secondary | ICD-10-CM

## 2017-12-05 MED ORDER — OMEPRAZOLE 40 MG PO CPDR: 40.0000 mg | DELAYED_RELEASE_CAPSULE | Freq: Two times a day (BID) | ORAL | 5 refills | Status: DC

## 2017-12-05 MED ORDER — AMOXICILLIN 500 MG PO CAPS: 500.0000 mg | ORAL_CAPSULE | Freq: Three times a day (TID) | ORAL | 0 refills | Status: DC

## 2017-12-05 MED ORDER — TERCONAZOLE 0.8 % VA CREA: TOPICAL_CREAM | VAGINAL | 0 refills | Status: DC

## 2017-12-05 NOTE — Patient Instructions (Addendum)
Drink fluids Tylenol for pain or fever  Warm compress on face or ear  Start amoxicillin for ear infection   Rest  Sudafed is ok  flonase may be worth trying  Nasal saline also   I refilled other stomach and yeast medicine

## 2017-12-05 NOTE — Assessment & Plan Note (Signed)
With uri/allergic rhinitis  Cover with amoxicillin  terazol for yeast if needed  Tylenol for pain  Warm compress flonase if tolerated Update if not starting to improve in a week or if worsening

## 2017-12-05 NOTE — Progress Notes (Signed)
Subjective:    Patient ID: Karen Sanchez, female    DOB: [birth year], 32 y.o.   MRN: [id number]  HPI Here for L ear pain /ringing Nasal congestion  Sore throat   Started Saturday after mowing  Started with headache/ face  Also sneezing  Then got congested - green d/c /now a little clearer  ST from the beginning - was raw feeling/ today a bit better   No cough    Sudafed- helped  Lots of fluids   Tends to avoid allergy medicines due to nosebleeds   Sees GI soon- takes ppi bid / needs refill until then   Also gets yeast with abx-needs terazol   Patient Active Problem List   Diagnosis Date Noted  . OME (otitis media with effusion), left 12/05/2017  . Viral URI 12/05/2017  . Thoracic back pain 06/25/2017  . IBS (irritable bowel syndrome) 07/23/2016  . Gastritis 05/25/2016  . Joint pain 11/16/2015  . Right hand pain 11/16/2015  . Left knee pain 11/16/2015  . Plantar fasciitis 11/13/2015  . Depot contraception 07/12/2014  . Headache(784.0) 08/28/2013  . Routine general medical examination at a health care facility 03/11/2013  . Encounter for routine gynecological examination 03/11/2013  . 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 HYPOGLYCEMIA 07/12/2006   Past Medical History:  Diagnosis Date  . Anemia   . Anxiety   . Depression   . Encounter for initial prescription of injectable contraceptive 03/11/2017  . Fundic gland polyps of stomach, benign   . Hypoglycemia    Past Surgical History:  Procedure Laterality Date  . INDUCED ABORTION     age 31   Social History   Tobacco Use  . Smoking status: Never Smoker  . Smokeless tobacco: Never Used  Substance Use Topics  . Alcohol use: No    Alcohol/week: 0.0 standard drinks  . Drug use: No   Family History  Problem Relation Age of Onset  . Diabetes Mother   . Cancer Maternal Aunt        breast  . Cancer Paternal Aunt        breast  . Diabetes Father   . Diabetes Maternal Grandmother    paternal grandmother  . Breast cancer Maternal Aunt    Allergies  Allergen Reactions  . Citric Acid     Vaginal irritation   Current Outpatient Medications on File Prior to Visit  Medication Sig Dispense Refill  . desvenlafaxine (PRISTIQ) 100 MG 24 hr tablet Take 100 mg by mouth daily.    . methocarbamol (ROBAXIN) 500 MG tablet Take 1 tablet (500 mg total) by mouth 3 (three) times daily as needed for muscle spasms. 30 tablet 1  . promethazine (PHENERGAN) 25 MG tablet Take 1 tablet (25 mg total) by mouth every 8 (eight) hours as needed for nausea or vomiting. 20 tablet 1  . traZODone (DESYREL) 50 MG tablet Take 1 tablet by mouth at bedtime as needed.     Current Facility-Administered Medications on File Prior to Visit  Medication Dose Route Frequency Provider Last Rate Last Dose  . 0.9 %  sodium chloride infusion  500 mL Intravenous Continuous Ladene Artist, MD        Review of Systems  Constitutional: Positive for appetite change and fatigue. Negative for fever.  HENT: Positive for congestion, ear pain, postnasal drip, rhinorrhea, sinus pressure, sinus pain, sneezing and sore throat. Negative for ear discharge and voice change.   Eyes: Negative for pain and discharge.  Respiratory: Negative for cough, shortness of breath, wheezing and stridor.   Cardiovascular: Negative for chest pain.  Gastrointestinal: Positive for abdominal pain and nausea. Negative for diarrhea and vomiting.       Still wrestling with gastritis   Genitourinary: Negative for frequency, hematuria and urgency.       Gets vaginal yeast with abx  Musculoskeletal: Negative for arthralgias and myalgias.  Skin: Negative for rash.  Neurological: Positive for headaches. Negative for dizziness, weakness and light-headedness.  Psychiatric/Behavioral: Negative for confusion and dysphoric mood.       Objective:   Physical Exam  Constitutional: She appears well-developed and well-nourished. No distress.  obese and well  appearing   HENT:  Head: Normocephalic and atraumatic.  Right Ear: Tympanic membrane, external ear and ear canal normal. There is drainage and tenderness.  Left Ear: External ear and ear canal normal. There is drainage and tenderness. A middle ear effusion is present.  Mouth/Throat: Oropharynx is clear and moist. No oropharyngeal exudate.  Nares are injected and congested  Bilateral maxillary sinus tenderness  Post nasal drip   L TM erythematous with effusion   Eyes: Pupils are equal, round, and reactive to light. Conjunctivae and EOM are normal. Right eye exhibits no discharge. Left eye exhibits no discharge.  Neck: Normal range of motion. Neck supple.  Cardiovascular: Normal rate and regular rhythm.  Pulmonary/Chest: Effort normal and breath sounds normal. No respiratory distress. She has no wheezes. She has no rales.  Good air exch  Lymphadenopathy:    She has no cervical adenopathy.  Neurological: She is alert. No cranial nerve deficit.  Skin: Skin is warm and dry. No rash noted. No erythema.  Psychiatric: She has a normal mood and affect.  Seems fatigued           Assessment & Plan:   Problem List Items Addressed This Visit      Respiratory   Viral URI    Along with nasal allergy symptoms and OM L  Fluids/rest/ note for work today  Tylenol  flonase if tolerated Nasal saline amox for OM Update if not starting to improve in a week or if worsening          Digestive   Gastritis    Refilled omeprazole 40 mg bid  Pending f/u with GI        Nervous and Auditory   OME (otitis media with effusion), left - Primary    With uri/allergic rhinitis  Cover with amoxicillin  terazol for yeast if needed  Tylenol for pain  Warm compress flonase if tolerated Update if not starting to improve in a week or if worsening        Relevant Medications   amoxicillin (AMOXIL) 500 MG capsule    Other Visit Diagnoses    Nausea and vomiting, intractability of vomiting not  specified, unspecified vomiting type       Relevant Medications   omeprazole (PRILOSEC) 40 MG capsule   Abdominal pain, epigastric       Relevant Medications   omeprazole (PRILOSEC) 40 MG capsule

## 2017-12-05 NOTE — Assessment & Plan Note (Signed)
Refilled omeprazole 40 mg bid  Pending f/u with GI

## 2017-12-05 NOTE — Assessment & Plan Note (Signed)
Along with nasal allergy symptoms and OM L  Fluids/rest/ note for work today  Tylenol  flonase if tolerated Nasal saline amox for OM Update if not starting to improve in a week or if worsening

## 2017-12-17 ENCOUNTER — Ambulatory Visit: Payer: Federal, State, Local not specified - PPO | Admitting: Physician Assistant

## 2018-01-02 ENCOUNTER — Ambulatory Visit (INDEPENDENT_AMBULATORY_CARE_PROVIDER_SITE_OTHER): Payer: Federal, State, Local not specified - PPO

## 2018-01-02 DIAGNOSIS — Z3042 Encounter for surveillance of injectable contraceptive: Secondary | ICD-10-CM

## 2018-01-02 DIAGNOSIS — F331 Major depressive disorder, recurrent, moderate: Secondary | ICD-10-CM | POA: Diagnosis not present

## 2018-01-02 MED ORDER — MEDROXYPROGESTERONE ACETATE 150 MG/ML IM SUSY
150.0000 mg | PREFILLED_SYRINGE | Freq: Once | INTRAMUSCULAR | Status: AC
  Administered 2018-01-02: 150 mg via INTRAMUSCULAR

## 2018-01-02 NOTE — Progress Notes (Signed)
Per orders of Dr. Glori Bickers, injection of Medroxyprogesterone 150 mg IM given by Ozzie Hoyle. Patient tolerated injection well.

## 2018-02-10 ENCOUNTER — Telehealth: Payer: Self-pay | Admitting: Podiatry

## 2018-02-10 NOTE — Telephone Encounter (Signed)
I was calling to get a doctors note for work for my feet. I need to wear a specific type of shoe. Please call me back at [phone].

## 2018-02-11 NOTE — Telephone Encounter (Signed)
Left message informing pt, we had not seen her in office for over 2 years and if she was still unable to wear the shoe gear needed to perform her duties she needed an appt to be evaluated.

## 2018-02-25 ENCOUNTER — Encounter: Payer: Self-pay | Admitting: Podiatry

## 2018-02-26 DIAGNOSIS — F4323 Adjustment disorder with mixed anxiety and depressed mood: Secondary | ICD-10-CM | POA: Diagnosis not present

## 2018-02-27 DIAGNOSIS — M7732 Calcaneal spur, left foot: Secondary | ICD-10-CM | POA: Diagnosis not present

## 2018-02-27 DIAGNOSIS — D2372 Other benign neoplasm of skin of left lower limb, including hip: Secondary | ICD-10-CM | POA: Diagnosis not present

## 2018-02-27 DIAGNOSIS — M722 Plantar fascial fibromatosis: Secondary | ICD-10-CM | POA: Diagnosis not present

## 2018-02-27 DIAGNOSIS — M7731 Calcaneal spur, right foot: Secondary | ICD-10-CM | POA: Diagnosis not present

## 2018-03-02 NOTE — Progress Notes (Signed)
This encounter was created in error - please disregard.

## 2018-03-20 ENCOUNTER — Ambulatory Visit: Payer: Federal, State, Local not specified - PPO

## 2018-03-26 ENCOUNTER — Ambulatory Visit (INDEPENDENT_AMBULATORY_CARE_PROVIDER_SITE_OTHER): Payer: Federal, State, Local not specified - PPO

## 2018-03-26 DIAGNOSIS — Z3042 Encounter for surveillance of injectable contraceptive: Secondary | ICD-10-CM

## 2018-03-26 MED ORDER — MEDROXYPROGESTERONE ACETATE 150 MG/ML IM SUSP
150.0000 mg | Freq: Once | INTRAMUSCULAR | Status: AC
  Administered 2018-03-26: 150 mg via INTRAMUSCULAR

## 2018-03-26 NOTE — Progress Notes (Signed)
Per orders of Dr. Glori Bickers, injection of Depo Provera given by Kris Mouton. Patient tolerated injection well.

## 2018-06-18 ENCOUNTER — Other Ambulatory Visit: Payer: Self-pay

## 2018-06-18 ENCOUNTER — Ambulatory Visit (INDEPENDENT_AMBULATORY_CARE_PROVIDER_SITE_OTHER): Payer: Federal, State, Local not specified - PPO

## 2018-06-18 ENCOUNTER — Ambulatory Visit: Payer: Federal, State, Local not specified - PPO

## 2018-06-18 DIAGNOSIS — Z3042 Encounter for surveillance of injectable contraceptive: Secondary | ICD-10-CM

## 2018-06-18 MED ORDER — MEDROXYPROGESTERONE ACETATE 150 MG/ML IM SUSP
150.0000 mg | Freq: Once | INTRAMUSCULAR | Status: AC
  Administered 2018-06-18: 150 mg via INTRAMUSCULAR

## 2018-09-03 ENCOUNTER — Encounter: Payer: Self-pay | Admitting: Family Medicine

## 2018-09-03 ENCOUNTER — Ambulatory Visit (INDEPENDENT_AMBULATORY_CARE_PROVIDER_SITE_OTHER): Payer: Federal, State, Local not specified - PPO | Admitting: Family Medicine

## 2018-09-03 ENCOUNTER — Other Ambulatory Visit: Payer: Self-pay

## 2018-09-03 ENCOUNTER — Other Ambulatory Visit (HOSPITAL_COMMUNITY)
Admission: RE | Admit: 2018-09-03 | Discharge: 2018-09-03 | Disposition: A | Discharge status: Home / Self Care | Payer: Federal, State, Local not specified - PPO | Source: Ambulatory Visit | Attending: Family Medicine | Admitting: Family Medicine

## 2018-09-03 VITALS — BP 124/78 | HR 101 | Temp 98.0°F | Ht 63.0 in | Wt 172.4 lb

## 2018-09-03 DIAGNOSIS — F4322 Adjustment disorder with anxiety: Secondary | ICD-10-CM

## 2018-09-03 DIAGNOSIS — Z01419 Encounter for gynecological examination (general) (routine) without abnormal findings: Secondary | ICD-10-CM | POA: Insufficient documentation

## 2018-09-03 DIAGNOSIS — K2950 Unspecified chronic gastritis without bleeding: Secondary | ICD-10-CM | POA: Diagnosis not present

## 2018-09-03 DIAGNOSIS — Z3042 Encounter for surveillance of injectable contraceptive: Secondary | ICD-10-CM

## 2018-09-03 DIAGNOSIS — Z0000 Encounter for general adult medical examination without abnormal findings: Secondary | ICD-10-CM

## 2018-09-03 LAB — COMPREHENSIVE METABOLIC PANEL
ALT: 16 U/L (ref 0–35)
AST: 13 U/L (ref 0–37)
Albumin: 4.1 g/dL (ref 3.5–5.2)
Alkaline Phosphatase: 82 U/L (ref 39–117)
BUN: 11 mg/dL (ref 6–23)
CO2: 25 mEq/L (ref 19–32)
Calcium: 8.8 mg/dL (ref 8.4–10.5)
Chloride: 106 mEq/L (ref 96–112)
Creatinine, Ser: 0.77 mg/dL (ref 0.40–1.20)
GFR: 85.94 mL/min (ref >60.00)
Glucose, Bld: 142 mg/dL — ABNORMAL HIGH (ref 70–99)
Potassium: 3.4 mEq/L — ABNORMAL LOW (ref 3.5–5.1)
Sodium: 139 mEq/L (ref 135–145)
Total Bilirubin: 0.6 mg/dL (ref 0.2–1.2)
Total Protein: 6.8 g/dL (ref 6.0–8.3)

## 2018-09-03 LAB — LIPID PANEL
Cholesterol: 208 mg/dL — ABNORMAL HIGH (ref 0–200)
HDL: 54.1 mg/dL (ref >39.00)
LDL Cholesterol: 127 mg/dL — ABNORMAL HIGH (ref 0–99)
NonHDL: 154.21
Total CHOL/HDL Ratio: 4
Triglycerides: 135 mg/dL (ref 0.0–149.0)
VLDL: 27 mg/dL (ref 0.0–40.0)

## 2018-09-03 LAB — CBC WITH DIFFERENTIAL/PLATELET
Basophils Absolute: 0 10*3/uL (ref 0.0–0.1)
Basophils Relative: 0.5 % (ref 0.0–3.0)
Eosinophils Absolute: 0.3 10*3/uL (ref 0.0–0.7)
Eosinophils Relative: 4.7 % (ref 0.0–5.0)
HCT: 41.7 % (ref 36.0–46.0)
Hemoglobin: 14.4 g/dL (ref 12.0–15.0)
Lymphocytes Relative: 30.7 % (ref 12.0–46.0)
Lymphs Abs: 2 10*3/uL (ref 0.7–4.0)
MCHC: 34.5 g/dL (ref 30.0–36.0)
MCV: 85.8 fl (ref 78.0–100.0)
Monocytes Absolute: 0.7 10*3/uL (ref 0.1–1.0)
Monocytes Relative: 10.5 % (ref 3.0–12.0)
Neutro Abs: 3.4 10*3/uL (ref 1.4–7.7)
Neutrophils Relative %: 53.6 % (ref 43.0–77.0)
Platelets: 296 10*3/uL (ref 150.0–400.0)
RBC: 4.86 Mil/uL (ref 3.87–5.11)
RDW: 12.8 % (ref 11.5–15.5)
WBC: 6.4 10*3/uL (ref 4.0–10.5)

## 2018-09-03 LAB — TSH: TSH: 1.74 u[IU]/mL (ref 0.35–4.50)

## 2018-09-03 MED ORDER — PROMETHAZINE HCL 25 MG PO TABS: 25.0000 mg | ORAL_TABLET | Freq: Three times a day (TID) | ORAL | 3 refills | Status: DC | PRN

## 2018-09-03 MED ORDER — MEDROXYPROGESTERONE ACETATE 150 MG/ML IM SUSP
150.0000 mg | Freq: Once | INTRAMUSCULAR | Status: AC
  Administered 2018-09-03: 150 mg via INTRAMUSCULAR

## 2018-09-03 MED ORDER — OMEPRAZOLE 20 MG PO CPDR: 20.0000 mg | DELAYED_RELEASE_CAPSULE | Freq: Every day | ORAL | 3 refills | Status: DC

## 2018-09-03 NOTE — Patient Instructions (Signed)
Take care of yourself  Keep walking  Depot shot today   Pap done today   Eat a healthy diet the best you can   Call and let me know when you want a GI referral outside of this area

## 2018-09-03 NOTE — Assessment & Plan Note (Signed)
This is ongoing  Rev EGD done this year and abd [place] Was put on ppi high dose (omeprazole 40 bid)-she had side effects /lower GI from this and did not think it helped much  Pt req px for omeprazole 20 mg to take daily  She is watching diet (no dairy/fatty foods)  She plans on asking for ref for 2nd op (tertiary care center) in the future-will call when she desires it

## 2018-09-03 NOTE — Assessment & Plan Note (Signed)
Routine exam with pap  Enc self breast exams  Continues  depo contraception -doing well w/o menses  Wants to continue this (shot given today) Declines need for STD testing

## 2018-09-03 NOTE — Assessment & Plan Note (Signed)
Reviewed health habits including diet and exercise and skin cancer prevention Reviewed appropriate screening tests for age  Also reviewed health mt list, fam hx and immunization status , as well as social and family history   See HPI Labs done today for wellness Pap and gyn exam done today  Enc self breast exams  Enc healthy diet and exercise  Disc limiting portions for wt loss Disc safety during pandemic (mask wearing)

## 2018-09-03 NOTE — Progress Notes (Signed)
Subjective:    Patient ID: Karen Sanchez, female    DOB: 01/30/1986, 33 y.o.   MRN: [id number]  HPI  Here for health maintenance exam and to review chronic medical problems    Still goes to work / actually overburdened at work  It is hard to wear a mask when it is hot indoors but she does wear one  Gyn care/pap  7/16 -normal  Depo provera for contraception -needs that today  Working out very well for her  No period at all -likes it   Eating fairly well  Wants to loose weight -needs to cut portions  Exercise- does a lot of walking  Has a new dog -needs to get out with it   Wt Readings from Last 3 Encounters:  09/03/18 172 lb 6 oz (78.2 kg)  12/05/17 171 lb 12 oz (77.9 kg)  06/25/17 170 lb (77.1 kg)  30.53 kg/m   Taking pristiq and trazodone  Dr Lajoyce Corners  Mood is fairly stable  Has stress  H/o anxiety/adj reaction   Flu vaccine - did not get   Tetanus vaccine 11/18   HIV screen neg 10/11  H/o gastritis  Takes omeprazole 40 mg bid - it was causing diarrhea and she had to cut back Now not taking any  Needs refill of phenergan  She still struggles with it  Did not get better When ready for ref-she would like a 2nd opinion- will call when she desires one  Patient Active Problem List   Diagnosis Date Noted  . Thoracic back pain 06/25/2017  . IBS (irritable bowel syndrome) 07/23/2016  . Gastritis 05/25/2016  . Joint pain 11/16/2015  . Right hand pain 11/16/2015  . Left knee pain 11/16/2015  . Plantar fasciitis 11/13/2015  . Depot contraception 07/12/2014  . Headache(784.0) 08/28/2013  . Routine general medical examination at a health care facility 03/11/2013  . Encounter for routine gynecological examination 03/11/2013  . 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 HYPOGLYCEMIA 07/12/2006   Past Medical History:  Diagnosis Date  . Anemia   . Anxiety   . Depression   . Encounter for initial prescription of injectable contraceptive 03/11/2017  .  Fundic gland polyps of stomach, benign   . Hypoglycemia    Past Surgical History:  Procedure Laterality Date  . INDUCED ABORTION     age 52   Social History   Tobacco Use  . Smoking status: Never Smoker  . Smokeless tobacco: Never Used  Substance Use Topics  . Alcohol use: No    Alcohol/week: 0.0 standard drinks  . Drug use: No   Family History  Problem Relation Age of Onset  . Diabetes Mother   . Cancer Maternal Aunt        breast  . Cancer Paternal Aunt        breast  . Diabetes Father   . Diabetes Maternal Grandmother        paternal grandmother  . Breast cancer Maternal Aunt    Allergies  Allergen Reactions  . Citric Acid     Vaginal irritation   Current Outpatient Medications on File Prior to Visit  Medication Sig Dispense Refill  . desvenlafaxine (PRISTIQ) 100 MG 24 hr tablet Take 100 mg by mouth daily.    . traZODone (DESYREL) 50 MG tablet Take 1 tablet by mouth at bedtime as needed.     No current facility-administered medications on file prior to visit.     Review of Systems  Constitutional:  Positive for fatigue. Negative for activity change, appetite change, fever and unexpected weight change.  HENT: Negative for congestion, ear pain, rhinorrhea, sinus pressure and sore throat.   Eyes: Negative for pain, redness and visual disturbance.  Respiratory: Negative for cough, shortness of breath and wheezing.   Cardiovascular: Negative for chest pain and palpitations.  Gastrointestinal: Positive for abdominal pain and nausea. Negative for abdominal distention, anal bleeding, blood in stool, constipation, diarrhea, rectal pain and vomiting.       Gastritis with epigastric pain and nausea comes and goes  Endocrine: Negative for polydipsia and polyuria.  Genitourinary: Negative for dysuria, frequency, pelvic pain, urgency, vaginal bleeding, vaginal discharge and vaginal pain.  Musculoskeletal: Negative for arthralgias, back pain and myalgias.  Skin: Negative for  pallor and rash.  Allergic/Immunologic: Negative for environmental allergies and food allergies.  Neurological: Negative for dizziness, syncope and headaches.  Hematological: Negative for adenopathy. Does not bruise/bleed easily.  Psychiatric/Behavioral: Positive for sleep disturbance. Negative for agitation, decreased concentration and dysphoric mood. The patient is nervous/anxious.        Depression/anxiety is fairly controlled        Objective:   Physical Exam Constitutional:      General: She is not in acute distress.    Appearance: Normal appearance. She is well-developed. She is obese. She is not ill-appearing.  HENT:     Head: Normocephalic and atraumatic.     Right Ear: Tympanic membrane, ear canal and external ear normal.     Left Ear: Tympanic membrane, ear canal and external ear normal.     Nose: Nose normal.     Mouth/Throat:     Mouth: Mucous membranes are moist.     Pharynx: Oropharynx is clear. No posterior oropharyngeal erythema.  Eyes:     General: No scleral icterus.       Right eye: No discharge.        Left eye: No discharge.     Extraocular Movements: Extraocular movements intact.     Conjunctiva/sclera: Conjunctivae normal.     Pupils: Pupils are equal, round, and reactive to light.  Neck:     Musculoskeletal: Normal range of motion and neck supple.     Thyroid: No thyromegaly.     Vascular: No carotid bruit or JVD.  Cardiovascular:     Rate and Rhythm: Normal rate and regular rhythm.     Heart sounds: Normal heart sounds. No gallop.   Pulmonary:     Effort: Pulmonary effort is normal. No respiratory distress.     Breath sounds: Normal breath sounds. No wheezing.  Chest:     Chest wall: No tenderness.  Abdominal:     General: Bowel sounds are normal. There is no distension or abdominal bruit.     Palpations: Abdomen is soft. There is no mass.     Tenderness: There is no abdominal tenderness.  Genitourinary:    General: Normal vulva.     Comments:  Breast exam: No mass, nodules, thickening, tenderness, bulging, retraction, inflamation, nipple discharge or skin changes noted.  No axillary or clavicular LA.    (dense breasts)          Anus appears normal w/o hemorrhoids or masses     External genitalia : nl appearance and hair distribution/no lesions     Urethral meatus : nl size, no lesions or prolapse     Urethra: no masses, tenderness or scarring    Bladder : no masses or tenderness     Vagina:  nl general appearance, no discharge or  Lesions, no significant cystocele  or rectocele     Cervix: no lesions/ discharge or friability    Uterus: nl size, contour, position, and mobility (not fixed) , non tender    Adnexa : no masses, tenderness, enlargement or nodularity       Musculoskeletal: Normal range of motion.        General: No tenderness.  Lymphadenopathy:     Cervical: No cervical adenopathy.  Skin:    General: Skin is warm and dry.     Capillary Refill: Capillary refill takes less than 2 seconds.     Coloration: Skin is not jaundiced or pale.     Findings: No erythema or rash.     Comments: Several brown nevi of different sizes on back and trunk  Neurological:     Mental Status: She is alert. Mental status is at baseline.     Cranial Nerves: No cranial nerve deficit.     Motor: No abnormal muscle tone.     Coordination: Coordination normal.     Deep Tendon Reflexes: Reflexes are normal and symmetric. Reflexes normal.  Psychiatric:        Mood and Affect: Mood normal.           Assessment & Plan:   Problem List Items Addressed This Visit      Digestive   Gastritis    This is ongoing  Rev EGD done this year and abd [place] Was put on ppi high dose (omeprazole 40 bid)-she had side effects /lower GI from this and did not think it helped much  Pt req px for omeprazole 20 mg to take daily  She is watching diet (no dairy/fatty foods)  She plans on asking for ref for 2nd op (tertiary care center)  in the future-will call when she desires it         Other   [place]    Ongoing  Pt continues pristiq and trazodone per psychiatry (Dr Lajoyce Corners)  States mood is fairly stable Stressors come and go      Routine general medical examination at a health care facility - Primary    Reviewed health habits including diet and exercise and skin cancer prevention Reviewed appropriate screening tests for age  Also reviewed health mt list, fam hx and immunization status , as well as social and family history   See HPI Labs done today for wellness Pap and gyn exam done today  Enc self breast exams  Enc healthy diet and exercise  Disc limiting portions for wt loss Disc safety during pandemic (mask wearing)      Relevant Orders   CBC with Differential/Platelet   Comprehensive metabolic panel   Lipid panel   TSH   Encounter for routine gynecological examination    Routine exam with pap  Enc self breast exams  Continues  depo contraception -doing well w/o menses  Wants to continue this (shot given today) Declines need for STD testing      Relevant Orders   Cytology - PAP([place])   Depot contraception    Due for her depo shot today Working well with no menses Plans to continue      Relevant Medications   medroxyPROGESTERone (DEPO-PROVERA) injection 150 mg (Completed)

## 2018-09-03 NOTE — Assessment & Plan Note (Signed)
Ongoing  Pt continues pristiq and trazodone per psychiatry (Dr Lajoyce Corners)  States mood is fairly stable Stressors come and go

## 2018-09-03 NOTE — Assessment & Plan Note (Signed)
Due for her depo shot today Working well with no menses Plans to continue

## 2018-09-04 ENCOUNTER — Other Ambulatory Visit (INDEPENDENT_AMBULATORY_CARE_PROVIDER_SITE_OTHER): Payer: Federal, State, Local not specified - PPO

## 2018-09-04 DIAGNOSIS — R7309 Other abnormal glucose: Secondary | ICD-10-CM | POA: Diagnosis not present

## 2018-09-04 LAB — HEMOGLOBIN A1C: Hgb A1c MFr Bld: 5.9 % (ref 4.6–6.5)

## 2018-09-05 LAB — CYTOLOGY - PAP
Diagnosis: NEGATIVE
HPV: NOT DETECTED

## 2018-09-13 ENCOUNTER — Other Ambulatory Visit: Payer: Self-pay | Admitting: *Deleted

## 2018-09-13 DIAGNOSIS — R6889 Other general symptoms and signs: Secondary | ICD-10-CM | POA: Diagnosis not present

## 2018-09-13 DIAGNOSIS — Z20822 Contact with and (suspected) exposure to covid-19: Secondary | ICD-10-CM

## 2018-09-15 LAB — NOVEL CORONAVIRUS, NAA: SARS-CoV-2, NAA: NOT DETECTED

## 2018-09-16 ENCOUNTER — Ambulatory Visit (INDEPENDENT_AMBULATORY_CARE_PROVIDER_SITE_OTHER): Payer: Federal, State, Local not specified - PPO | Admitting: Family Medicine

## 2018-09-16 ENCOUNTER — Encounter: Payer: Self-pay | Admitting: Family Medicine

## 2018-09-16 ENCOUNTER — Telehealth: Payer: Self-pay

## 2018-09-16 DIAGNOSIS — R197 Diarrhea, unspecified: Secondary | ICD-10-CM | POA: Insufficient documentation

## 2018-09-16 DIAGNOSIS — R519 Headache, unspecified: Secondary | ICD-10-CM | POA: Insufficient documentation

## 2018-09-16 DIAGNOSIS — G4489 Other headache syndrome: Secondary | ICD-10-CM | POA: Diagnosis not present

## 2018-09-16 NOTE — Patient Instructions (Signed)
Keep drinking fluids  Tylenol for headache as needed (alert [place] for fever or neck stiffness)  pepto bismol is ok prn for diarrhea with caution Phenergan for nausea  I will order lab and stool tests- the office will call you to set that up   Update if not starting to improve in a week or if worsening  -especially if worsening abdominal pain or headache or s/s dehydration or return of vomiting (or any other changes)

## 2018-09-16 NOTE — Assessment & Plan Note (Addendum)
Diarrhea with headache (? Dehydrated) body aches /malaise and nausea (no vomiting since Thursday) Nausea slowed down Diarrhea persists (suspect infectious) abd pain-unsure if more than baseline (has IBS and epigastric pain)  Neg covid test-re assuring  Will order stool tests for drop off / c diff and stool cx Also cbc and cmet Disc poss of viral cause or food bourne  Enc phenergan/tylenol prn and check temp  Rev s/s of dehydration to watch for  Small dose of pepto ok for diarrhea if needed Work note

## 2018-09-16 NOTE — Telephone Encounter (Signed)
[facility] - Client Nonclinical Telephone Record [facility] Client [name] - Client Client Site [place] - Night Contact Type Call Who Is Calling Patient / Member / Family / Caregiver Caller Name [place] Phone Number [phone] Patient Name Karen Sanchez Patient DOB May 23, 1985 Call Type Message Only Information Provided Reason for Call Request for General Office Information Initial Comment Caller is wanting to schedule an appt. Additional Comment Call Closed By: Felecia Jan Transaction Date/Time: 09/16/2018 7:43:14 AM (ET)

## 2018-09-16 NOTE — Telephone Encounter (Signed)
Patient scheduled appointment today at 12:00 with Dr.Tower.

## 2018-09-16 NOTE — Progress Notes (Signed)
Virtual Visit via Video Note  I connected with Karen Sanchez on 09/16/18 at 12:00 PM EDT by a video enabled telemedicine application and verified that I am speaking with the correct person using two identifiers.  Location: Patient: home Provider: office    I discussed the limitations of evaluation and management by telemedicine and the availability of in person appointments. The patient expressed understanding and agreed to proceed.  Video did not work on pt's end  We completed visit by phone History of Present Illness: Pt presents with diarrhea and headache/fatigue for 5 d covid test neg  Started a week ago -felt poorly  By Thursday- throbbing HA and nausea and diarrhea  No vomiting -since thurs  Tries to eat- does not make a difference in her symptoms   No respiratory symptoms   Diarrhea 5-6 times per day  Stool is watery or loose -depending if she has eaten No blood   Some abdominal pain - cramping before diarrhea some of the time  Some tenderness in middle (normal for her)   Her IBS and heartburn are no more than usual/chronic   Has had body aches  Has not taken temp    Has not been on any abx recently  No hosp or inst exposure   covid test was negative   ? If any sick contacts at work   Has been drinking fluids as much as she can   Constant throbbing headache- used ice packs  occ migraine in the past   Over the counter-tylenol   Nausea has improved some   Does not remember what she ate before this started  Has eaten some carry out  Most of the time fast food / chicken  Does not remember eating anything "off" tasting   Patient Active Problem List   Diagnosis Date Noted  . Diarrhea 09/16/2018  . Headache 09/16/2018  . IBS (irritable bowel syndrome) 07/23/2016  . Gastritis 05/25/2016  . Depot contraception 07/12/2014  . Headache(784.0) 08/28/2013  . Routine general medical examination at a health care facility 03/11/2013  . Encounter for  routine gynecological examination 03/11/2013  . 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 HYPOGLYCEMIA 07/12/2006   Past Medical History:  Diagnosis Date  . Anemia   . Anxiety   . Depression   . Encounter for initial prescription of injectable contraceptive 03/11/2017  . Fundic gland polyps of stomach, benign   . Hypoglycemia    Past Surgical History:  Procedure Laterality Date  . INDUCED ABORTION     age 62   Social History   Tobacco Use  . Smoking status: Never Smoker  . Smokeless tobacco: Never Used  Substance Use Topics  . Alcohol use: No    Alcohol/week: 0.0 standard drinks  . Drug use: No   Family History  Problem Relation Age of Onset  . Diabetes Mother   . Cancer Maternal Aunt        breast  . Cancer Paternal Aunt        breast  . Diabetes Father   . Diabetes Maternal Grandmother        paternal grandmother  . Breast cancer Maternal Aunt    Allergies  Allergen Reactions  . Citric Acid     Vaginal irritation   Current Outpatient Medications on File Prior to Visit  Medication Sig Dispense Refill  . desvenlafaxine (PRISTIQ) 100 MG 24 hr tablet Take 100 mg by mouth daily.    Marland Kitchen omeprazole (PRILOSEC) 20 MG capsule Take  1 capsule (20 mg total) by mouth daily. 90 capsule 3  . promethazine (PHENERGAN) 25 MG tablet Take 1 tablet (25 mg total) by mouth every 8 (eight) hours as needed for nausea or vomiting. 30 tablet 3  . traZODone (DESYREL) 50 MG tablet Take 1 tablet by mouth at bedtime as needed.     No current facility-administered medications on file prior to visit.      Review of Systems  Constitutional: Positive for malaise/fatigue. Negative for chills, diaphoresis, fever and weight loss.  HENT: Negative for congestion, sinus pain and sore throat.   Eyes: Negative for discharge and redness.  Respiratory: Negative for cough and shortness of breath.   Cardiovascular: Negative for chest pain, palpitations and leg swelling.  Gastrointestinal: Positive  for abdominal pain, diarrhea and nausea. Negative for blood in stool, heartburn, melena and vomiting.  Genitourinary: Negative for dysuria, frequency and hematuria.  Musculoskeletal: Positive for myalgias.  Skin: Negative for rash.  Neurological: Positive for headaches. Negative for dizziness.  Endo/Heme/Allergies: Negative for polydipsia.    Observations/Objective: Patient sounds fatigued but not distressed Slightly anxious  Not hoarse  No cough or sob on phone  Normal cognition/answers questions normally  Assessment and Plan: Problem List Items Addressed This Visit      Other   Diarrhea    Diarrhea with headache (? Dehydrated) body aches /malaise and nausea (no vomiting since Thursday) Nausea slowed down Diarrhea persists (suspect infectious) abd pain-unsure if more than baseline (has IBS and epigastric pain)  Neg covid test-re assuring  Will order stool tests for drop off / c diff and stool cx Also cbc and cmet Disc poss of viral cause or food bourne  Enc phenergan/tylenol prn and check temp  Rev s/s of dehydration to watch for  Small dose of pepto ok for diarrhea if needed Work note       Relevant Orders   Stool culture   C. difficile GDH and Toxin A/B   CBC with Differential/Platelet   Comprehensive metabolic panel   Headache    I suspect this may be due to fluid losses from diarrhea or viral No neck stiffness Disc fluid rehydration  Tylenol advised prn  Watch temp         Follow Up Instructions: Keep drinking fluids  Tylenol for headache as needed (alert [place] for fever or neck stiffness)  pepto bismol is ok prn for diarrhea with caution Phenergan for nausea  I will order lab and stool tests- the office will call you to set that up   Update if not starting to improve in a week or if worsening  -especially if worsening abdominal pain or headache or s/s dehydration or return of vomiting (or any other changes)   I discussed the assessment and treatment plan  with the patient. The patient was provided an opportunity to ask questions and all were answered. The patient agreed with the plan and demonstrated an understanding of the instructions.   The patient was advised to call back or seek an in-person evaluation if the symptoms worsen or if the condition fails to improve as anticipated.     Loura Pardon, MD

## 2018-09-16 NOTE — Assessment & Plan Note (Signed)
I suspect this may be due to fluid losses from diarrhea or viral No neck stiffness Disc fluid rehydration  Tylenol advised prn  Watch temp

## 2018-09-17 ENCOUNTER — Other Ambulatory Visit (INDEPENDENT_AMBULATORY_CARE_PROVIDER_SITE_OTHER): Payer: Federal, State, Local not specified - PPO

## 2018-09-17 DIAGNOSIS — R197 Diarrhea, unspecified: Secondary | ICD-10-CM | POA: Diagnosis not present

## 2018-09-17 LAB — CBC WITH DIFFERENTIAL/PLATELET
Basophils Absolute: 0 10*3/uL (ref 0.0–0.1)
Basophils Relative: 0.5 % (ref 0.0–3.0)
Eosinophils Absolute: 0.3 10*3/uL (ref 0.0–0.7)
Eosinophils Relative: 4.1 % (ref 0.0–5.0)
HCT: 42 % (ref 36.0–46.0)
Hemoglobin: 14.2 g/dL (ref 12.0–15.0)
Lymphocytes Relative: 28.2 % (ref 12.0–46.0)
Lymphs Abs: 1.9 10*3/uL (ref 0.7–4.0)
MCHC: 33.9 g/dL (ref 30.0–36.0)
MCV: 86.4 fl (ref 78.0–100.0)
Monocytes Absolute: 0.8 10*3/uL (ref 0.1–1.0)
Monocytes Relative: 12.6 % — ABNORMAL HIGH (ref 3.0–12.0)
Neutro Abs: 3.7 10*3/uL (ref 1.4–7.7)
Neutrophils Relative %: 54.6 % (ref 43.0–77.0)
Platelets: 286 10*3/uL (ref 150.0–400.0)
RBC: 4.86 Mil/uL (ref 3.87–5.11)
RDW: 13.3 % (ref 11.5–15.5)
WBC: 6.7 10*3/uL (ref 4.0–10.5)

## 2018-09-17 LAB — COMPREHENSIVE METABOLIC PANEL
ALT: 26 U/L (ref 0–35)
AST: 15 U/L (ref 0–37)
Albumin: 4.1 g/dL (ref 3.5–5.2)
Alkaline Phosphatase: 81 U/L (ref 39–117)
BUN: 7 mg/dL (ref 6–23)
CO2: 24 mEq/L (ref 19–32)
Calcium: 9 mg/dL (ref 8.4–10.5)
Chloride: 108 mEq/L (ref 96–112)
Creatinine, Ser: 0.7 mg/dL (ref 0.40–1.20)
GFR: 95.91 mL/min (ref >60.00)
Glucose, Bld: 111 mg/dL — ABNORMAL HIGH (ref 70–99)
Potassium: 3.9 mEq/L (ref 3.5–5.1)
Sodium: 138 mEq/L (ref 135–145)
Total Bilirubin: 0.6 mg/dL (ref 0.2–1.2)
Total Protein: 6.4 g/dL (ref 6.0–8.3)

## 2018-09-26 DIAGNOSIS — F4323 Adjustment disorder with mixed anxiety and depressed mood: Secondary | ICD-10-CM | POA: Diagnosis not present

## 2018-10-05 ENCOUNTER — Ambulatory Visit (INDEPENDENT_AMBULATORY_CARE_PROVIDER_SITE_OTHER)
Admission: RE | Admit: 2018-10-05 | Discharge: 2018-10-05 | Disposition: A | Discharge status: Home / Self Care | Payer: Federal, State, Local not specified - PPO | Source: Ambulatory Visit

## 2018-10-05 DIAGNOSIS — K0889 Other specified disorders of teeth and supporting structures: Secondary | ICD-10-CM

## 2018-10-05 MED ORDER — IBUPROFEN 800 MG PO TABS: 800.0000 mg | ORAL_TABLET | Freq: Three times a day (TID) | ORAL | 0 refills | Status: DC

## 2018-10-05 MED ORDER — AMOXICILLIN-POT CLAVULANATE 875-125 MG PO TABS: 1.0000 | ORAL_TABLET | Freq: Two times a day (BID) | ORAL | 0 refills | Status: AC

## 2018-10-05 NOTE — ED Provider Notes (Signed)
Virtual Visit via Video Note:  Aracelie Lucianne Muss  initiated request for Telemedicine visit with [facility] Urgent [facility] team. I connected with Nobuko Lucianne Muss  on 10/05/2018 at 10:34 AM  for a synchronized telemedicine visit using a video enabled HIPPA compliant telemedicine application. I verified that I am speaking with [place]  using two identifiers. Hallie C Wieters, PA-C  was physically located in a [facility] Urgent care site and Jalayiah Truc Winfree was located at a different location.   The limitations of evaluation and management by telemedicine as well as the availability of in-person appointments were discussed. Patient was informed that she  may incur a bill ( including co-pay) for this virtual visit encounter. [place]  expressed understanding and gave verbal consent to proceed with virtual visit.     History of Present Illness:Karen Sanchez  is a 33 y.o. female presents with concern over dental infection.  Patient states that beginning Friday, 3 days ago she started developed pain and swelling above her front tooth.  She has has pain over her left front tooth and is noticed the gums above have become swollen and very intensely red.  She has difficulty moving her upper lip due to pain and swelling.  Has noted some mild swelling below her nose.  Denies any fevers or neck stiffness.  Notes that she has had root canals and veneers placed on 8 of her upper teeth, but not on the left front tooth that is causing her pain.  She has been using salt water gargles and trying to brush and floss more frequently without relief.  Past Medical History:  Diagnosis Date  . Anemia   . Anxiety   . Depression   . Encounter for initial prescription of injectable contraceptive 03/11/2017  . Fundic gland polyps of stomach, benign   . Hypoglycemia     Allergies  Allergen Reactions  . Citric Acid     Vaginal irritation         Observations/Objective: Physical Exam  Constitutional: She is oriented to person, place, and time. No distress.  HENT:  Head: Normocephalic and atraumatic.  Area above upper lip appears slightly swollen , patient unable to fully evert upper lip due to swelling and pain, gingiva observed did appear erythematous, no obvious dental abnormality, but video quality limiting exam  Neck: Normal range of motion. Neck supple.  Moving neck appropriately, no obvious neck swelling or erythema noted  Pulmonary/Chest: Effort normal. No respiratory distress.  Speaking in full sentences  Musculoskeletal:     Comments: Moving extremities appropriately  Neurological: She is alert and oriented to person, place, and time.  Speech clear, face symmetric     Assessment and Plan: Patient with gingival swelling and dental pain, will treat for dental infection/abscess, will initiate on Augmentin twice daily x1 week.  Tylenol and ibuprofen for pain.  Continue cold compresses and warm compresses.  Follow-up in person if symptoms progressing or worsening.  Follow-up with dentistry for further evaluation of any recurrent pain.Discussed strict return precautions. Patient verbalized understanding and is agreeable with plan.   Follow Up Instructions:    I discussed the assessment and treatment plan with the patient. The patient was provided an opportunity to ask questions and all were answered. The patient agreed with the plan and demonstrated an understanding of the instructions.   The patient was advised to call back or seek an in-person evaluation if the symptoms worsen or if the condition fails to  improve as anticipated.      Janith Lima, PA-C  10/05/2018 10:34 AM        Janith Lima, PA-C 10/05/18 [id number]

## 2018-10-05 NOTE — Discharge Instructions (Signed)
Augmentin twice daily for 1 week Use anti-inflammatories for pain/swelling. You may take up to 800 mg Ibuprofen every 8 hours with food. You may supplement Ibuprofen with Tylenol [phone] mg every 8 hours.

## 2018-11-19 ENCOUNTER — Ambulatory Visit (INDEPENDENT_AMBULATORY_CARE_PROVIDER_SITE_OTHER): Payer: Federal, State, Local not specified - PPO

## 2018-11-19 DIAGNOSIS — Z3042 Encounter for surveillance of injectable contraceptive: Secondary | ICD-10-CM

## 2018-11-19 MED ORDER — MEDROXYPROGESTERONE ACETATE 150 MG/ML IM SUSP
150.0000 mg | Freq: Once | INTRAMUSCULAR | Status: AC
  Administered 2018-11-19: 150 mg via INTRAMUSCULAR

## 2018-11-19 NOTE — Progress Notes (Signed)
Per orders of Dr. Glori Bickers, injection of Medroxyprogesterone given by Kris Mouton. Patient tolerated injection well.  Depo dates for next visit are 02/04/2019-02/18/2019

## 2018-11-26 ENCOUNTER — Ambulatory Visit: Payer: Federal, State, Local not specified - PPO

## 2019-02-05 ENCOUNTER — Ambulatory Visit (INDEPENDENT_AMBULATORY_CARE_PROVIDER_SITE_OTHER): Payer: Federal, State, Local not specified - PPO | Admitting: *Deleted

## 2019-02-05 DIAGNOSIS — Z23 Encounter for immunization: Secondary | ICD-10-CM

## 2019-02-05 DIAGNOSIS — Z3042 Encounter for surveillance of injectable contraceptive: Secondary | ICD-10-CM

## 2019-02-05 MED ORDER — MEDROXYPROGESTERONE ACETATE 150 MG/ML IM SUSP
150.0000 mg | Freq: Once | INTRAMUSCULAR | Status: AC
  Administered 2019-02-05: 150 mg via INTRAMUSCULAR

## 2019-02-05 NOTE — Progress Notes (Signed)
Per orders of Dr. Damita Dunnings in Dr Marliss Coots absence, injection of Depo Provera 150mg /mL given by Nyoka Cowden, Karen Sanchez. Patient tolerated injection well.  Pt also received her Flu vaccine while she was here.

## 2019-02-22 DIAGNOSIS — Z20828 Contact with and (suspected) exposure to other viral communicable diseases: Secondary | ICD-10-CM | POA: Diagnosis not present

## 2019-04-28 ENCOUNTER — Telehealth: Payer: Self-pay

## 2019-04-28 NOTE — Telephone Encounter (Signed)
Pt has NV apt on 1/21. Need to screen her for covid and let her know it will be done curbside. LVM

## 2019-04-30 ENCOUNTER — Ambulatory Visit (INDEPENDENT_AMBULATORY_CARE_PROVIDER_SITE_OTHER): Payer: Federal, State, Local not specified - PPO | Admitting: *Deleted

## 2019-04-30 DIAGNOSIS — Z3042 Encounter for surveillance of injectable contraceptive: Secondary | ICD-10-CM

## 2019-04-30 MED ORDER — MEDROXYPROGESTERONE ACETATE 150 MG/ML IM SUSP
150.0000 mg | Freq: Once | INTRAMUSCULAR | Status: AC
  Administered 2019-04-30: 150 mg via INTRAMUSCULAR

## 2019-04-30 NOTE — Progress Notes (Signed)
Per orders of Dr. Silvio Pate, injection of Depo-Provera given by Tammi Sou. Patient tolerated injection well.  PCP out of the office

## 2019-05-13 DIAGNOSIS — F4322 Adjustment disorder with anxiety: Secondary | ICD-10-CM | POA: Diagnosis not present

## 2019-05-26 DIAGNOSIS — F410 Panic disorder [episodic paroxysmal anxiety] without agoraphobia: Secondary | ICD-10-CM | POA: Diagnosis not present

## 2019-05-26 DIAGNOSIS — F4310 Post-traumatic stress disorder, unspecified: Secondary | ICD-10-CM | POA: Diagnosis not present

## 2019-07-20 ENCOUNTER — Telehealth: Payer: Self-pay

## 2019-07-20 NOTE — Telephone Encounter (Signed)
Pt has NV tomorrow and needs screened for covid symptoms. LVM

## 2019-07-21 ENCOUNTER — Ambulatory Visit (INDEPENDENT_AMBULATORY_CARE_PROVIDER_SITE_OTHER): Payer: Federal, State, Local not specified - PPO

## 2019-07-21 DIAGNOSIS — Z3042 Encounter for surveillance of injectable contraceptive: Secondary | ICD-10-CM | POA: Diagnosis not present

## 2019-07-21 MED ORDER — MEDROXYPROGESTERONE ACETATE 150 MG/ML IM SUSP
150.0000 mg | Freq: Once | INTRAMUSCULAR | Status: AC
  Administered 2019-07-21: 150 mg via INTRAMUSCULAR

## 2019-07-21 NOTE — Progress Notes (Signed)
Per orders of Dr. Glori Bickers, injection of Depo-Provera given by Brenton Grills. Patient tolerated injection well.

## 2019-08-29 ENCOUNTER — Encounter: Payer: Self-pay | Admitting: Emergency Medicine

## 2019-08-29 ENCOUNTER — Emergency Department
Admission: EM | Admit: 2019-08-29 | Discharge: 2019-08-29 | Disposition: A | Discharge status: Home / Self Care | Payer: Federal, State, Local not specified - PPO | Attending: Emergency Medicine | Admitting: Emergency Medicine

## 2019-08-29 ENCOUNTER — Other Ambulatory Visit: Payer: Self-pay

## 2019-08-29 DIAGNOSIS — W290XXA Contact with powered kitchen appliance, initial encounter: Secondary | ICD-10-CM | POA: Insufficient documentation

## 2019-08-29 DIAGNOSIS — Y9389 Activity, other specified: Secondary | ICD-10-CM | POA: Diagnosis not present

## 2019-08-29 DIAGNOSIS — S61011A Laceration without foreign body of right thumb without damage to nail, initial encounter: Secondary | ICD-10-CM | POA: Diagnosis not present

## 2019-08-29 DIAGNOSIS — Y999 Unspecified external cause status: Secondary | ICD-10-CM | POA: Insufficient documentation

## 2019-08-29 DIAGNOSIS — Y929 Unspecified place or not applicable: Secondary | ICD-10-CM | POA: Diagnosis not present

## 2019-08-29 NOTE — ED Triage Notes (Signed)
Pt cut thumb right hand on vegetable slicer.  tdap < 5 yrs. Dressing applied. Active bleeding.

## 2019-08-29 NOTE — ED Provider Notes (Signed)
Emergency Department Provider Note  ____________________________________________  Time seen: Approximately 6:58 PM  I have reviewed the triage vital signs and the nursing notes.   HISTORY  Chief Complaint Laceration   Historian Patient     HPI Karen Sanchez is a 34 y.o. female presents to the emergency department with an avulsion type laceration sustained to the lateral aspect of the right thumb using a meat slicer.  Patient reports that her tetanus status is up-to-date.  Patient has been able to actively move right thumb since injury occurred.  No other alleviating measures have been attempted.   Past Medical History:  Diagnosis Date  . Anemia   . Anxiety   . Depression   . Encounter for initial prescription of injectable contraceptive 03/11/2017  . Fundic gland polyps of stomach, benign   . Hypoglycemia      Immunizations up to date:  Yes.     Past Medical History:  Diagnosis Date  . Anemia   . Anxiety   . Depression   . Encounter for initial prescription of injectable contraceptive 03/11/2017  . Fundic gland polyps of stomach, benign   . Hypoglycemia     Patient Active Problem List   Diagnosis Date Noted  . Diarrhea 09/16/2018  . Headache 09/16/2018  . IBS (irritable bowel syndrome) 07/23/2016  . Gastritis 05/25/2016  . Depot contraception 07/12/2014  . Headache(784.0) 08/28/2013  . Routine general medical examination at a health care facility 03/11/2013  . Encounter for routine gynecological examination 03/11/2013  . 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 HYPOGLYCEMIA 07/12/2006    Past Surgical History:  Procedure Laterality Date  . INDUCED ABORTION     age 1    Prior to Admission medications   Medication Sig Start Date End Date Taking? Authorizing Provider  desvenlafaxine (PRISTIQ) 100 MG 24 hr tablet Take 100 mg by mouth daily.    [provider]  ibuprofen (ADVIL) 800 MG tablet Take 1 tablet (800 mg total) by  mouth 3 (three) times daily. 10/05/18   Wieters, Hallie C, PA-C  omeprazole (PRILOSEC) 20 MG capsule Take 1 capsule (20 mg total) by mouth daily. 09/03/18   Tower, Wynelle Fanny, MD  promethazine (PHENERGAN) 25 MG tablet Take 1 tablet (25 mg total) by mouth every 8 (eight) hours as needed for nausea or vomiting. 09/03/18   Tower, Wynelle Fanny, MD  traZODone (DESYREL) 50 MG tablet Take 1 tablet by mouth at bedtime as needed. 11/26/11   [provider]    Allergies Citric acid  Family History  Problem Relation Age of Onset  . Diabetes Mother   . Cancer Maternal Aunt        breast  . Cancer Paternal Aunt        breast  . Diabetes Father   . Diabetes Maternal Grandmother        paternal grandmother  . Breast cancer Maternal Aunt     Social History Social History   Tobacco Use  . Smoking status: Never Smoker  . Smokeless tobacco: Never Used  Substance Use Topics  . Alcohol use: No    Alcohol/week: 0.0 standard drinks  . Drug use: No     Review of Systems  Constitutional: No fever/chills Eyes:  No discharge ENT: No upper respiratory complaints. Respiratory: no cough. No SOB/ use of accessory muscles to breath Gastrointestinal:   No nausea, no vomiting.  No diarrhea.  No constipation. Musculoskeletal: Negative for musculoskeletal pain. Skin: Patient has right thumb laceration.  ____________________________________________   PHYSICAL EXAM:  VITAL SIGNS: ED Triage Vitals  Enc Vitals Group     BP 08/29/19 1728 138/86     Pulse Rate 08/29/19 1728 100     Resp 08/29/19 1728 18     Temp 08/29/19 1728 98.4 F (36.9 C)     Temp Source 08/29/19 1728 Oral     SpO2 08/29/19 1728 98 %     Weight 08/29/19 1725 170 lb (77.1 kg)     Height 08/29/19 1725 5\' 3"  (1.6 m)     Head Circumference --      Peak Flow --      Pain Score 08/29/19 1725 5     Pain Loc --      Pain Edu? --      Excl. in [place]? --      Constitutional: Alert and oriented. Well appearing and in no acute  distress. Eyes: Conjunctivae are normal. PERRL. EOMI. Head: Atraumatic. Cardiovascular: Normal rate, regular rhythm. Normal S1 and S2.  Good peripheral circulation. Respiratory: Normal respiratory effort without tachypnea or retractions. Lungs CTAB. Good air entry to the bases with no decreased or absent breath sounds Gastrointestinal: Bowel sounds x 4 quadrants. Soft and nontender to palpation. No guarding or rigidity. No distention. Musculoskeletal: Full range of motion to all extremities. No obvious deformities noted Neurologic:  Normal for age. No gross focal neurologic deficits are appreciated.  Skin: Patient has a 1 and half centimeter by 1/2 cm avulsion type, right thumb laceration. Psychiatric: Mood and affect are normal for age. Speech and behavior are normal.   ____________________________________________   LABS (all labs ordered are listed, but only abnormal results are displayed)  Labs Reviewed - No data to display ____________________________________________  EKG   ____________________________________________  RADIOLOGY   No results found.  ____________________________________________    PROCEDURES  Procedure(s) performed:     Marland KitchenMarland KitchenLaceration Repair  Date/Time: 08/29/2019 7:19 PM Performed by: Lannie Fields, PA-C Authorized by: Lannie Fields, PA-C   Consent:    Consent obtained:  Verbal   Consent given by:  Patient   Risks discussed:  Infection, pain, retained foreign body, poor cosmetic result and poor wound healing Anesthesia (see MAR for exact dosages):    Anesthesia method:  None Laceration details:    Location:  Finger   Finger location:  R thumb   Wound length (cm): 1.5 x 1.5.   Depth (mm):  1 Repair type:    Repair type:  Simple Exploration:    Hemostasis achieved with:  Direct pressure   Wound exploration: entire depth of wound probed and visualized     Contaminated: no   Treatment:    Area cleansed with:  Saline   Amount of  cleaning:  Extensive   Irrigation solution:  Sterile saline   Visualized foreign bodies/material removed: no   Skin repair:    Repair method:  Sutures Approximation:    Approximation:  Close Post-procedure details:    Dressing:  Sterile dressing   Patient tolerance of procedure:  Tolerated well, no immediate complications       Medications - No data to display   ____________________________________________   INITIAL IMPRESSION / ASSESSMENT AND PLAN / ED COURSE  Pertinent labs & imaging results that were available during my care of the patient were reviewed by me and considered in my medical decision making (see chart for details).    Assessment and plan: Finger laceration 34 year old female presents to the emergency department with an avulsion  type laceration of the right thumb.  Bleeding was controlled using a finger cot tourniquet.  Dermabond was applied.  Patient education regarding wound care was given.  Tetanus status is up-to-date.  All patient questions were answered.  ____________________________________________  FINAL CLINICAL IMPRESSION(S) / ED DIAGNOSES  Final diagnoses:  Laceration of right thumb without foreign body without damage to nail, initial encounter      NEW MEDICATIONS STARTED DURING THIS VISIT:  ED Discharge Orders    None          This chart was dictated using voice recognition software/Dragon. Despite best efforts to proofread, errors can occur which can change the meaning. Any change was purely unintentional.     Lannie Fields, PA-C 08/29/19 1921    Vanessa Chickas[place], MD 08/30/19 1038

## 2019-09-02 ENCOUNTER — Telehealth (INDEPENDENT_AMBULATORY_CARE_PROVIDER_SITE_OTHER): Payer: Federal, State, Local not specified - PPO | Admitting: Family Medicine

## 2019-09-02 ENCOUNTER — Encounter: Payer: Self-pay | Admitting: Family Medicine

## 2019-09-02 DIAGNOSIS — S61011S Laceration without foreign body of right thumb without damage to nail, sequela: Secondary | ICD-10-CM

## 2019-09-02 DIAGNOSIS — S61011A Laceration without foreign body of right thumb without damage to nail, initial encounter: Secondary | ICD-10-CM | POA: Insufficient documentation

## 2019-09-02 NOTE — Assessment & Plan Note (Signed)
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

## 2019-09-02 NOTE — Progress Notes (Signed)
Virtual Visit via Video Note  I connected with Karen Sanchez on 09/02/19 at  9:30 AM EDT by a video enabled telemedicine application and verified that I am speaking with the correct person using two identifiers.  Location: Patient: home Provider: office   I discussed the limitations of evaluation and management by telemedicine and the availability of in person appointments. The patient expressed understanding and agreed to proceed.  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 Laceration of thumb, right 09/02/2019  . Diarrhea 09/16/2018  . Headache 09/16/2018  . IBS (irritable bowel syndrome) 07/23/2016  . Gastritis 05/25/2016  . Depot contraception 07/12/2014  . Headache(784.0) 08/28/2013  . Routine general medical examination at a health care facility 03/11/2013  . Encounter for routine gynecological examination 03/11/2013  . 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 HYPOGLYCEMIA 07/12/2006   Past Medical History:  Diagnosis Date  . Anemia   . Anxiety   . Depression   . Encounter for initial prescription of injectable contraceptive 03/11/2017  . Fundic gland polyps of stomach, benign   . Hypoglycemia    Past Surgical History:   Procedure Laterality Date  . INDUCED ABORTION     age 67   Social History   Tobacco Use  . Smoking status: Never Smoker  . Smokeless tobacco: Never Used  Substance Use Topics  . Alcohol use: No    Alcohol/week: 0.0 standard drinks  . Drug use: No   Family History  Problem Relation Age of Onset  . Diabetes Mother   . Cancer Maternal Aunt        breast  . Cancer Paternal Aunt        breast  . Diabetes Father   . Diabetes Maternal Grandmother        paternal grandmother  . Breast cancer Maternal Aunt    Allergies  Allergen Reactions  . Citric Acid     Vaginal irritation   Current Outpatient Medications on File Prior to Visit  Medication Sig Dispense Refill  . desvenlafaxine (PRISTIQ) 100 MG 24 hr tablet Take 100 mg by mouth daily.    Marland Kitchen ibuprofen (ADVIL) 800 MG tablet Take 1 tablet (800 mg total) by mouth 3 (three) times daily. 21 tablet 0  . omeprazole (PRILOSEC) 20 MG capsule Take 1 capsule (20 mg total) by mouth daily. 90 capsule 3  . promethazine (PHENERGAN) 25 MG tablet Take 1 tablet (25 mg total) by mouth every 8 (eight) hours as needed for nausea or vomiting. 30 tablet 3  . traZODone (DESYREL) 50 MG tablet Take 1 tablet by  mouth at bedtime as needed.     No current facility-administered medications on file prior to visit.   Review of Systems  Constitutional: Negative for chills, fever and malaise/fatigue.  HENT: Negative for congestion, ear pain, sinus pain and sore throat.   Eyes: Negative for blurred vision, discharge and redness.  Respiratory: Negative for cough, shortness of breath and stridor.   Cardiovascular: Negative for chest pain, palpitations and leg swelling.  Gastrointestinal: Negative for abdominal pain, diarrhea, nausea and vomiting.  Musculoskeletal: Negative for myalgias.       Wound on thumb  Skin: Negative for rash.  Neurological: Negative for dizziness and headaches.      Observations/Objective: Patient appears well, in no  distress Weight is baseline  No facial swelling or asymmetry Normal voice-not hoarse and no slurred speech No obvious tremor or mobility impairment Moving neck and UEs normally Able to hear the call well  No cough or shortness of breath during interview  Talkative and mentally sharp with no cognitive changes Wound of lateral R thumb looks to be healing with wound edges well approx (on camera) and able to bend with some discomfort  Affect is normal    Assessment and Plan: Problem List Items Addressed This Visit      Other   Laceration of thumb, right    Caused by slicer on 123456 Out of work until June 1 (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 The patient was provided an opportunity to ask questions and all were answered. The patient agreed with the plan and demonstrated an understanding of the instructions.   The patient was advised to call back or seek an in-person evaluation if the symptoms worsen or if the condition fails to improve as anticipated.     Loura Pardon, MD

## 2019-09-02 NOTE — Patient Instructions (Signed)
Cover the thumb lightly if needed  Clean with soap and water  Do not submerge   Alert [place] if increased redness or swelling or pain   Return to work on 6/1 if feeling better

## 2019-09-17 ENCOUNTER — Ambulatory Visit: Payer: Federal, State, Local not specified - PPO | Admitting: Family Medicine

## 2019-09-17 ENCOUNTER — Encounter: Payer: Self-pay | Admitting: Family Medicine

## 2019-09-17 ENCOUNTER — Other Ambulatory Visit: Payer: Self-pay

## 2019-09-17 VITALS — BP 120/86 | HR 104 | Temp 97.7°F | Ht 63.0 in | Wt 189.8 lb

## 2019-09-17 DIAGNOSIS — L03113 Cellulitis of right upper limb: Secondary | ICD-10-CM | POA: Diagnosis not present

## 2019-09-17 MED ORDER — DOXYCYCLINE HYCLATE 100 MG PO TABS
100.0000 mg | ORAL_TABLET | Freq: Two times a day (BID) | ORAL | 0 refills | Status: DC
Start: 2019-09-17 — End: 2019-11-03

## 2019-09-17 NOTE — Assessment & Plan Note (Addendum)
Likely bacterial infection ( less likely allergic reaciton to bite as no itching and slower onset of lesion)... will treat antibitoics to cover MRSa given higher risk job.  Treat with warm compresses.  No fluctuance/ indication for I and D. Treat with warm compresses.  Follow up  In 5 days, sooner if redness spreading or fever.

## 2019-09-17 NOTE — Patient Instructions (Signed)
Treat with warm compresses 3-4 times daily.  Complete antibiotics.  Can apply topical hydrocortisone cream (like cortisone) 10 twice daily for inflammation.  Call if fever or redness spreading on antibiotics.

## 2019-09-17 NOTE — Progress Notes (Signed)
Chief Complaint  Patient presents with   Insect Bite    R arm     History of Present Illness: HPI   34 year old female presents for  new onset insect bite on right arm. Noted with bite in right mid arm several weeks ago. Never saw and insect.  Now in last week noted swelling and redness right arm. Warmth, pain.  No discharge.    Has been applying heat and cold, nothing else.  No flu like symptoms  no fever.   no past history of skin infections.  No family contacts with MRSA.  Work for [employer].    This visit occurred during the SARS-CoV-2 public health emergency.  Safety protocols were in place, including screening questions prior to the visit, additional usage of staff PPE, and extensive cleaning of exam room while observing appropriate contact time as indicated for disinfecting solutions.   COVID 19 screen:  No recent travel or known exposure to COVID19 The patient denies respiratory symptoms of COVID 19 at this time. The importance of social distancing was discussed today.     Review of Systems  Constitutional: Negative for chills and fever.  HENT: Negative for congestion and ear pain.   Eyes: Negative for pain and redness.  Respiratory: Negative for cough and shortness of breath.   Cardiovascular: Negative for chest pain, palpitations and leg swelling.  Gastrointestinal: Negative for abdominal pain, blood in stool, constipation, diarrhea, nausea and vomiting.  Genitourinary: Negative for dysuria.  Musculoskeletal: Negative for falls and myalgias.  Skin: Positive for rash.  Neurological: Negative for dizziness.  Psychiatric/Behavioral: Negative for depression. The patient is not nervous/anxious.       Past Medical History:  Diagnosis Date   Anemia    Anxiety    Depression    Encounter for initial prescription of injectable contraceptive 03/11/2017   Fundic gland polyps of stomach, benign    Hypoglycemia     reports that she has never smoked. She has  never used smokeless tobacco. She reports that she does not drink alcohol and does not use drugs.   Current Outpatient Medications:    desvenlafaxine (PRISTIQ) 100 MG 24 hr tablet, Take 100 mg by mouth daily., Disp: , Rfl:    omeprazole (PRILOSEC) 20 MG capsule, Take 1 capsule (20 mg total) by mouth daily., Disp: 90 capsule, Rfl: 3   promethazine (PHENERGAN) 25 MG tablet, Take 1 tablet (25 mg total) by mouth every 8 (eight) hours as needed for nausea or vomiting., Disp: 30 tablet, Rfl: 3   traZODone (DESYREL) 50 MG tablet, Take 1 tablet by mouth at bedtime as needed., Disp: , Rfl:    Observations/Objective: Blood pressure 120/86, pulse (!) 104, temperature 97.7 F (36.5 C), temperature source Temporal, height 5\' 3"  (1.6 m), weight 189 lb 12 oz (86.1 kg), SpO2 98 %.  Physical Exam Constitutional:      General: She is not in acute distress.    Appearance: Normal appearance. She is well-developed. She is not ill-appearing or toxic-appearing.  HENT:     Head: Normocephalic.     Right Ear: Hearing, tympanic membrane, ear canal and external ear normal. Tympanic membrane is not erythematous, retracted or bulging.     Left Ear: Hearing, tympanic membrane, ear canal and external ear normal. Tympanic membrane is not erythematous, retracted or bulging.     Nose: No mucosal edema or rhinorrhea.     Right Sinus: No maxillary sinus tenderness or frontal sinus tenderness.  Left Sinus: No maxillary sinus tenderness or frontal sinus tenderness.     Mouth/Throat:     Pharynx: Uvula midline.  Eyes:     General: Lids are normal. Lids are everted, no foreign bodies appreciated.     Conjunctiva/sclera: Conjunctivae normal.     Pupils: Pupils are equal, round, and reactive to light.  Neck:     Thyroid: No thyroid mass or thyromegaly.     Vascular: No carotid bruit.     Trachea: Trachea normal.  Cardiovascular:     Rate and Rhythm: Normal rate and regular rhythm.     Pulses: Normal pulses.      Heart sounds: Normal heart sounds, S1 normal and S2 normal. No murmur heard.  No friction rub. No gallop.   Pulmonary:     Effort: Pulmonary effort is normal. No tachypnea or respiratory distress.     Breath sounds: Normal breath sounds. No decreased breath sounds, wheezing, rhonchi or rales.  Abdominal:     General: Bowel sounds are normal.     Palpations: Abdomen is soft.     Tenderness: There is no abdominal tenderness.  Musculoskeletal:     Cervical back: Normal range of motion and neck supple.  Skin:    General: Skin is warm and dry.     Findings: No rash.          Comments: Erythema and heat  On inner upper right arm.. outer circle 4 inch diameter , inner darker red lesion with induration and central pore 1 inch.. marked with sharpie  Neurological:     Mental Status: She is alert.  Psychiatric:        Mood and Affect: Mood is not anxious or depressed.        Speech: Speech normal.        Behavior: Behavior normal. Behavior is cooperative.        Thought Content: Thought content normal.        Judgment: Judgment normal.      Assessment and Plan   Right arm cellulitis Likely bacterial infection ( less likely allergic reaciton to bite as no itching and slower onset of lesion)... will treat antibitoics to cover MRSa given higher risk job.  Treat with warm compresses.  No fluctuance/ indication for I and D. Treat with warm compresses.  Follow up  In 5 days, sooner if redness spreading or fever.     Eliezer Lofts, MD

## 2019-09-25 ENCOUNTER — Telehealth: Payer: Self-pay

## 2019-09-25 DIAGNOSIS — K2950 Unspecified chronic gastritis without bleeding: Secondary | ICD-10-CM

## 2019-09-25 DIAGNOSIS — K580 Irritable bowel syndrome with diarrhea: Secondary | ICD-10-CM

## 2019-09-25 NOTE — Telephone Encounter (Signed)
New message    Patient calling - MD is aware of previous conversation    Referral  : Gastrologist   Facility: [facility]

## 2019-09-26 NOTE — Telephone Encounter (Signed)
Referral done

## 2019-10-07 ENCOUNTER — Other Ambulatory Visit: Payer: Self-pay

## 2019-10-07 ENCOUNTER — Ambulatory Visit (INDEPENDENT_AMBULATORY_CARE_PROVIDER_SITE_OTHER): Payer: Federal, State, Local not specified - PPO

## 2019-10-07 DIAGNOSIS — Z3042 Encounter for surveillance of injectable contraceptive: Secondary | ICD-10-CM

## 2019-10-07 MED ORDER — MEDROXYPROGESTERONE ACETATE 150 MG/ML IM SUSP
150.0000 mg | Freq: Once | INTRAMUSCULAR | Status: AC
  Administered 2019-10-07: 150 mg via INTRAMUSCULAR

## 2019-10-07 NOTE — Progress Notes (Signed)
Per orders of Dr. Glori Bickers, injection of depo-provera given by Randall An. Patient tolerated injection well.

## 2019-10-14 ENCOUNTER — Other Ambulatory Visit: Payer: Self-pay | Admitting: *Deleted

## 2019-10-14 MED ORDER — OMEPRAZOLE 20 MG PO CPDR: 20.0000 mg | DELAYED_RELEASE_CAPSULE | Freq: Every day | ORAL | 0 refills | Status: DC

## 2019-11-03 ENCOUNTER — Ambulatory Visit: Payer: Federal, State, Local not specified - PPO | Admitting: Family Medicine

## 2019-11-03 ENCOUNTER — Encounter: Payer: Self-pay | Admitting: Family Medicine

## 2019-11-03 ENCOUNTER — Ambulatory Visit (INDEPENDENT_AMBULATORY_CARE_PROVIDER_SITE_OTHER)
Admission: RE | Admit: 2019-11-03 | Discharge: 2019-11-03 | Disposition: A | Discharge status: Home / Self Care | Payer: Federal, State, Local not specified - PPO | Source: Ambulatory Visit | Attending: Family Medicine | Admitting: Family Medicine

## 2019-11-03 ENCOUNTER — Other Ambulatory Visit: Payer: Self-pay

## 2019-11-03 VITALS — BP 122/60 | HR 119 | Temp 96.9°F | Ht 63.0 in | Wt 191.0 lb

## 2019-11-03 DIAGNOSIS — S59909A Unspecified injury of unspecified elbow, initial encounter: Secondary | ICD-10-CM | POA: Insufficient documentation

## 2019-11-03 DIAGNOSIS — S59901A Unspecified injury of right elbow, initial encounter: Secondary | ICD-10-CM | POA: Diagnosis not present

## 2019-11-03 DIAGNOSIS — N760 Acute vaginitis: Secondary | ICD-10-CM | POA: Insufficient documentation

## 2019-11-03 DIAGNOSIS — M7989 Other specified soft tissue disorders: Secondary | ICD-10-CM | POA: Diagnosis not present

## 2019-11-03 DIAGNOSIS — N761 Subacute and chronic vaginitis: Secondary | ICD-10-CM

## 2019-11-03 DIAGNOSIS — M25521 Pain in right elbow: Secondary | ICD-10-CM | POA: Diagnosis not present

## 2019-11-03 MED ORDER — TERCONAZOLE 0.4 % VA CREA: TOPICAL_CREAM | VAGINAL | 0 refills | Status: DC

## 2019-11-03 NOTE — Progress Notes (Signed)
Subjective:    Patient ID: Karen Sanchez, female    DOB: [birth year], 34 y.o.   MRN: [id number]  This visit occurred during the SARS-CoV-2 public health emergency.  Safety protocols were in place, including screening questions prior to the visit, additional usage of staff PPE, and extensive cleaning of exam room while observing appropriate contact time as indicated for disinfecting solutions.    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 EXAM: RIGHT ELBOW - COMPLETE 3+ VIEW COMPARISON:  None. FINDINGS: There is no evidence of fracture, dislocation, or joint effusion. There is no evidence of arthropathy or other focal bone abnormality. Soft tissues are unremarkable. IMPRESSION: Negative. Electronically Signed   By: Marijo Conception M.D.   On: 11/03/2019 14:52     Patient Active Problem List   Diagnosis Date Noted  . Elbow injury, initial encounter 11/03/2019  . Right arm cellulitis 09/17/2019  . Laceration of thumb, right 09/02/2019  . Diarrhea 09/16/2018  . Headache 09/16/2018  . IBS (irritable bowel syndrome) 07/23/2016  . Gastritis 05/25/2016  . Depot contraception 07/12/2014  .  Headache(784.0) 08/28/2013  . Routine general medical examination at a health care facility 03/11/2013  . Encounter for routine gynecological examination 03/11/2013  . 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 HYPOGLYCEMIA 07/12/2006   Past Medical History:  Diagnosis Date  . Anemia   . Anxiety   . Depression   . Encounter for initial prescription of injectable contraceptive 03/11/2017  . Fundic gland polyps of stomach, benign   . Hypoglycemia    Past Surgical History:  Procedure Laterality Date  . INDUCED ABORTION     age 25   Social History   Tobacco Use  . Smoking status: Never Smoker  . Smokeless tobacco: Never Used  Vaping Use  . Vaping Use: Never used  Substance Use Topics  . Alcohol use: No    Alcohol/week: 0.0 standard drinks  . Drug use: No   Family History  Problem Relation Age of Onset  . Diabetes Mother   . Cancer Maternal Aunt        breast  . Cancer Paternal Aunt        breast  . Diabetes Father   . Diabetes Maternal Grandmother        paternal grandmother  . Breast cancer Maternal Aunt    Allergies  Allergen Reactions  . Citric Acid     Vaginal irritation   Current Outpatient Medications on File Prior to Visit  Medication Sig Dispense Refill  .  desvenlafaxine (PRISTIQ) 100 MG 24 hr tablet Take 100 mg by mouth daily.    Marland Kitchen omeprazole (PRILOSEC) 20 MG capsule Take 1 capsule (20 mg total) by mouth daily. 90 capsule 0  . promethazine (PHENERGAN) 25 MG tablet Take 1 tablet (25 mg total) by mouth every 8 (eight) hours as needed for nausea or vomiting. 30 tablet 3  . traZODone (DESYREL) 50 MG tablet Take 1 tablet by mouth at bedtime as needed.     No current facility-administered medications on file prior to visit.    Review of Systems  Constitutional: Negative for activity change, appetite change, fatigue, fever and unexpected weight change.  HENT: Negative for congestion, ear pain, rhinorrhea, sinus pressure and sore throat.   Eyes: Negative  for pain, redness and visual disturbance.  Respiratory: Negative for cough, shortness of breath and wheezing.   Cardiovascular: Negative for chest pain and palpitations.  Gastrointestinal: Negative for abdominal pain, blood in stool, constipation and diarrhea.  Endocrine: Negative for polydipsia and polyuria.  Genitourinary: Negative for dysuria, frequency and urgency.  Musculoskeletal: Negative for arthralgias, back pain and myalgias.       R elbow pain with swelling  Skin: Negative for pallor and rash.  Allergic/Immunologic: Negative for environmental allergies.  Neurological: Negative for dizziness, syncope and headaches.  Hematological: Negative for adenopathy. Does not bruise/bleed easily.  Psychiatric/Behavioral: Negative for decreased concentration and dysphoric mood. The patient is not nervous/anxious.        Objective:   Physical Exam Constitutional:      General: She is not in acute distress.    Appearance: Normal appearance. She is obese. She is not ill-appearing.  HENT:     Head: Normocephalic and atraumatic.  Eyes:     Conjunctiva/sclera: Conjunctivae normal.     Pupils: Pupils are equal, round, and reactive to light.  Cardiovascular:     Rate and Rhythm: Regular rhythm. Tachycardia present.  Musculoskeletal:        General: Swelling, tenderness and signs of injury present. No deformity.     Right elbow: Swelling present. No deformity, effusion or lacerations. Normal range of motion. Tenderness present in lateral epicondyle. No olecranon process tenderness.     Right forearm: No swelling, deformity, tenderness or bony tenderness.     Right wrist: No swelling. Normal range of motion.     Cervical back: Neck supple.     Right lower leg: No edema.     Left lower leg: No edema.     Comments: R elbow- tender laterally  No crepitus or deformity  Swelling is mild/scant ecchymosis noted  No neuro changes to arm  Hurts to fully flex elbow  Nl pronation/supination     Lymphadenopathy:     Cervical: No cervical adenopathy.  Skin:    Coloration: Skin is not pale.     Findings: No erythema or rash.  Neurological:     Mental Status: She is alert.     Sensory: No sensory deficit.     Motor: No weakness.     Deep Tendon Reflexes: Reflexes normal.  Psychiatric:        Mood and Affect: Mood is anxious.           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

## 2019-11-03 NOTE — Assessment & Plan Note (Signed)
Pt desires px of terazol for use externally for yeast infection  Refilled today

## 2019-11-03 NOTE — Patient Instructions (Signed)
Xray now  Continue ice Elevate when able  Use ace wrap during the day for mild compression   If any sudden worsening let us know   Tylenol as needed   No work- today/tomorrow

## 2019-11-03 NOTE — Assessment & Plan Note (Signed)
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

## 2019-11-16 DIAGNOSIS — F4322 Adjustment disorder with anxiety: Secondary | ICD-10-CM | POA: Diagnosis not present

## 2020-01-05 ENCOUNTER — Telehealth: Payer: Self-pay | Admitting: *Deleted

## 2020-01-05 ENCOUNTER — Ambulatory Visit: Payer: Federal, State, Local not specified - PPO

## 2020-01-05 DIAGNOSIS — K529 Noninfective gastroenteritis and colitis, unspecified: Secondary | ICD-10-CM | POA: Diagnosis not present

## 2020-01-05 DIAGNOSIS — K2970 Gastritis, unspecified, without bleeding: Secondary | ICD-10-CM | POA: Diagnosis not present

## 2020-01-05 DIAGNOSIS — R195 Other fecal abnormalities: Secondary | ICD-10-CM | POA: Diagnosis not present

## 2020-01-05 DIAGNOSIS — R140 Abdominal distension (gaseous): Secondary | ICD-10-CM | POA: Diagnosis not present

## 2020-01-05 DIAGNOSIS — R112 Nausea with vomiting, unspecified: Secondary | ICD-10-CM | POA: Diagnosis not present

## 2020-01-05 DIAGNOSIS — R1010 Upper abdominal pain, unspecified: Secondary | ICD-10-CM | POA: Diagnosis not present

## 2020-01-05 NOTE — Telephone Encounter (Signed)
Left VM requesting pt to call the office back. (PER DPR) I did leave VM letting pt know why I called.   Pt cancelled and r/s depo shot from today 01/05/20 to 01/07/20. Pt has to have her depo shot on or before 01/06/20 for keep her in range. If pt comes in for depo on 01/07/20 we can not do it she would need a pregnancy test and then come back in a week to get another pregnancy test and if that pregnancy test is negative then pt would be allowed to get depo then. I offered pt to come in still this afternoon or tomorrow so she would be within her timeframe to get depo shot.

## 2020-01-06 ENCOUNTER — Ambulatory Visit (INDEPENDENT_AMBULATORY_CARE_PROVIDER_SITE_OTHER): Payer: Federal, State, Local not specified - PPO

## 2020-01-06 ENCOUNTER — Other Ambulatory Visit: Payer: Self-pay

## 2020-01-06 DIAGNOSIS — Z3042 Encounter for surveillance of injectable contraceptive: Secondary | ICD-10-CM | POA: Diagnosis not present

## 2020-01-06 DIAGNOSIS — Z23 Encounter for immunization: Secondary | ICD-10-CM

## 2020-01-06 MED ORDER — MEDROXYPROGESTERONE ACETATE 150 MG/ML IM SUSP
150.0000 mg | Freq: Once | INTRAMUSCULAR | Status: AC
  Administered 2020-01-06: 150 mg via INTRAMUSCULAR

## 2020-01-07 ENCOUNTER — Ambulatory Visit: Payer: Federal, State, Local not specified - PPO

## 2020-01-07 NOTE — Progress Notes (Signed)
Per orders of Dr. Danise Mina, in Dr. Marliss Coots absence, an injection of Depo-Provera given by Loreen Freud. Patient tolerated injection well.

## 2020-01-11 ENCOUNTER — Other Ambulatory Visit: Payer: Self-pay | Admitting: Family Medicine

## 2020-01-12 NOTE — Telephone Encounter (Signed)
Med refilled once and Carrie will reach out to pt to try and get appt scheduled  

## 2020-01-12 NOTE — Telephone Encounter (Signed)
Please schedule a PE and refill until then Winter is fine

## 2020-01-12 NOTE — Telephone Encounter (Signed)
Pt has had multiple recent acute appts. But I don't see a f/u or CPE in a few years, please advise

## 2020-01-13 NOTE — Telephone Encounter (Signed)
I left a detailed message on patient's voice mail to return my call.  Please schedule cpx in the winter.

## 2020-02-08 DIAGNOSIS — R112 Nausea with vomiting, unspecified: Secondary | ICD-10-CM | POA: Diagnosis not present

## 2020-02-08 DIAGNOSIS — R1010 Upper abdominal pain, unspecified: Secondary | ICD-10-CM | POA: Diagnosis not present

## 2020-03-14 ENCOUNTER — Encounter: Payer: Federal, State, Local not specified - PPO | Admitting: Family Medicine

## 2020-03-14 ENCOUNTER — Telehealth: Payer: Self-pay | Admitting: *Deleted

## 2020-03-14 NOTE — Telephone Encounter (Signed)
appt cancelled per Dr. Glori Bickers, and left VM letting pt know appt was cancelled but if she thinks she needs a virtual visit to discuss what's wrong with her she can call us back and schedule a virtual visit appt

## 2020-03-14 NOTE — Telephone Encounter (Signed)
Patient left a voicemail stating that she had tried to call earlier and cancel her appointment today but has not been able to get thru on the appointment/cancellation line. Patient requested that appointment be cancelled today at 4:00  because she is not feeling well. Tried to call patient back, but got her voicemail.

## 2020-03-23 ENCOUNTER — Other Ambulatory Visit: Payer: Self-pay

## 2020-03-23 ENCOUNTER — Ambulatory Visit (INDEPENDENT_AMBULATORY_CARE_PROVIDER_SITE_OTHER): Payer: Federal, State, Local not specified - PPO

## 2020-03-23 DIAGNOSIS — Z3042 Encounter for surveillance of injectable contraceptive: Secondary | ICD-10-CM | POA: Diagnosis not present

## 2020-03-23 MED ORDER — MEDROXYPROGESTERONE ACETATE 150 MG/ML IM SUSP
150.0000 mg | Freq: Once | INTRAMUSCULAR | Status: AC
  Administered 2020-03-23: 150 mg via INTRAMUSCULAR

## 2020-03-23 NOTE — Progress Notes (Signed)
Per orders of Dr. Glori Bickers, injection of Depo Provera, given by Aneta Mins, RN. Patient tolerated injection well in L Ventrogluteal.

## 2020-04-19 ENCOUNTER — Telehealth: Payer: Federal, State, Local not specified - PPO | Admitting: Nurse Practitioner

## 2020-04-19 DIAGNOSIS — K591 Functional diarrhea: Secondary | ICD-10-CM | POA: Diagnosis not present

## 2020-04-19 NOTE — Progress Notes (Signed)
We are sorry that you are not feeling well.  Here is how we plan to help!  Based on what you have shared with me it looks like you have Acute Infectious Diarrhea.  * work note is in your =my chart- can only do 2 days  Most cases of acute diarrhea are due to infections with virus and bacteria and are self-limited conditions lasting less than 14 days.  For your symptoms you may take Imodium 2 mg tablets that are over the counter at your local pharmacy. Take two tablet now and then one after each loose stool up to 6 a day.  Antibiotics are not needed for most people with diarrhea.     HOME CARE  We recommend changing your diet to help with your symptoms for the next few days.  Drink plenty of fluids that contain water salt and sugar. Sports drinks such as Gatorade may help.   You may try broths, soups, bananas, applesauce, soft breads, mashed potatoes or crackers.   You are considered infectious for as long as the diarrhea continues. Hand washing or use of alcohol based hand sanitizers is recommend.  It is best to stay out of work or school until your symptoms stop.   GET HELP RIGHT AWAY  If you have dark yellow colored urine or do not pass urine frequently you should drink more fluids.    If your symptoms worsen   If you feel like you are going to pass out (faint)  You have a new problem  MAKE SURE YOU   Understand these instructions.  Will watch your condition.  Will get help right away if you are not doing well or get worse.  Your e-visit answers were reviewed by a board certified advanced clinical practitioner to complete your personal care plan.  Depending on the condition, your plan could have included both over the counter or prescription medications.  If there is a problem please reply  once you have received a response from your provider.  Your safety is important to [place].  If you have drug allergies check your prescription carefully.    You can use MyChart to  ask questions about today's visit, request a non-urgent call back, or ask for a work or school excuse for 24 hours related to this e-Visit. If it has been greater than 24 hours you will need to follow up with your provider, or enter a new e-Visit to address those concerns.   You will get an e-mail in the next two days asking about your experience.  I hope that your e-visit has been valuable and will speed your recovery. Thank you for using e-visits.  .5-10 minutes spent reviewing and documenting in chart.

## 2020-04-21 ENCOUNTER — Ambulatory Visit: Admit: 2020-04-21 | Discharge status: Against Medical Advice | Payer: Federal, State, Local not specified - PPO

## 2020-04-21 ENCOUNTER — Emergency Department
Admission: EM | Admit: 2020-04-21 | Discharge: 2020-04-21 | Disposition: A | Discharge status: Home / Self Care | Payer: Federal, State, Local not specified - PPO | Attending: Emergency Medicine | Admitting: Emergency Medicine

## 2020-04-21 ENCOUNTER — Emergency Department: Payer: Federal, State, Local not specified - PPO

## 2020-04-21 ENCOUNTER — Other Ambulatory Visit: Payer: Self-pay

## 2020-04-21 DIAGNOSIS — W540XXA Bitten by dog, initial encounter: Secondary | ICD-10-CM | POA: Diagnosis not present

## 2020-04-21 DIAGNOSIS — S61451A Open bite of right hand, initial encounter: Secondary | ICD-10-CM | POA: Diagnosis not present

## 2020-04-21 DIAGNOSIS — Z79899 Other long term (current) drug therapy: Secondary | ICD-10-CM | POA: Insufficient documentation

## 2020-04-21 DIAGNOSIS — R600 Localized edema: Secondary | ICD-10-CM | POA: Diagnosis not present

## 2020-04-21 DIAGNOSIS — S6991XA Unspecified injury of right wrist, hand and finger(s), initial encounter: Secondary | ICD-10-CM | POA: Diagnosis not present

## 2020-04-21 DIAGNOSIS — S61411A Laceration without foreign body of right hand, initial encounter: Secondary | ICD-10-CM | POA: Diagnosis not present

## 2020-04-21 DIAGNOSIS — Y93K9 Activity, other involving animal care: Secondary | ICD-10-CM | POA: Diagnosis not present

## 2020-04-21 DIAGNOSIS — M7989 Other specified soft tissue disorders: Secondary | ICD-10-CM | POA: Diagnosis not present

## 2020-04-21 MED ORDER — AMOXICILLIN-POT CLAVULANATE 875-125 MG PO TABS: 1.0000 | ORAL_TABLET | Freq: Two times a day (BID) | ORAL | 0 refills | Status: AC

## 2020-04-21 NOTE — ED Notes (Addendum)
Pt to ED with c/o animal bite. Pt states she was trying to break up her german shepherd and her mom's golden retriever when she got bit.  Pt states both dogs are up to date on all vaccinations.

## 2020-04-21 NOTE — Discharge Instructions (Signed)
Clean the wound with soap and water daily.  Take Augmentin for prevention of infection.  Follow-up with orthopedics

## 2020-04-21 NOTE — ED Triage Notes (Signed)
Pt bit by dogs on right hand. Up to date on shots. Dogs known (own dogs). [place]. Two puncture wounds to top and palm of hand. Bleeding controlled.
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
## 2020-04-22 ENCOUNTER — Telehealth: Payer: Self-pay | Admitting: Family Medicine

## 2020-04-22 NOTE — Telephone Encounter (Signed)
I reviewed her note from the ER for a dog bite and wrote her note. How is she doing?

## 2020-04-22 NOTE — Telephone Encounter (Signed)
Done Let me know if it is not correct

## 2020-04-22 NOTE — Telephone Encounter (Signed)
PT CALLED IN WANTED TO KNOW IF SHE CAN GET A DOCTORS NOTE FROM 11TH-13TH .  PLEASE ADVISE

## 2020-04-22 NOTE — Telephone Encounter (Signed)
Pt was needing note from the tele-health visit on the 11th. I advise pt Dr. Glori Bickers did work note but I do see the provider she saw on the 11th also wrote her a work note. Pt said she will print it off of mychart   Pt said regarding her hand for the dog bite it's still "touch and go" but said she can't use fingers/bend them, she can't make a fist and her hand still feels really tender and numb

## 2020-04-25 ENCOUNTER — Ambulatory Visit: Payer: Federal, State, Local not specified - PPO | Admitting: Internal Medicine

## 2020-04-25 ENCOUNTER — Ambulatory Visit: Payer: Federal, State, Local not specified - PPO | Admitting: Family Medicine

## 2020-04-27 ENCOUNTER — Telehealth (INDEPENDENT_AMBULATORY_CARE_PROVIDER_SITE_OTHER): Payer: Federal, State, Local not specified - PPO | Admitting: Family Medicine

## 2020-04-27 ENCOUNTER — Encounter: Payer: Self-pay | Admitting: Family Medicine

## 2020-04-27 DIAGNOSIS — S61401A Unspecified open wound of right hand, initial encounter: Secondary | ICD-10-CM

## 2020-04-27 DIAGNOSIS — W540XXS Bitten by dog, sequela: Secondary | ICD-10-CM

## 2020-04-27 DIAGNOSIS — W540XXA Bitten by dog, initial encounter: Secondary | ICD-10-CM | POA: Insufficient documentation

## 2020-04-27 NOTE — Assessment & Plan Note (Signed)
Right hand  approx 1 inch dorsal wound and smaller puncture wound in thenar area  Mild swelling of hand  Pain to make a fist but able  Pt was seen in ER and augmentin px but she did not take in light of upcoming hydrogen breath test I am worried about infection (disc pasturella possible)  Adv to go ahead and take the augmentin (she will need to postpone her GI test)  Adv to clean with soap and water instead of peroxide Watch closely for increased redness or any drainage Also for inc pain or swelling  ER parameters discussed  F/u in office tomorrow or friday

## 2020-04-27 NOTE — Patient Instructions (Addendum)
Clean wounds with soap and water instead of peroxide  Elevate hand when you can to help swelling  If worse (increased redness or swelling or pain or any red streaks then alert [place] and go to urgent care or ER)  Take the augmentin as directed for infection  Sorry you will have to re schedule your GI testing   The office will contact you to schedule an in person visit in the next 1-2 days

## 2020-04-27 NOTE — Progress Notes (Signed)
Virtual Visit via Video Note  I connected with Karen Sanchez on 04/27/20 at  3:00 PM EST by a video enabled telemedicine application and verified that I am speaking with the correct person using two identifiers.  Location: Patient: home Provider: office   I discussed the limitations of evaluation and management by telemedicine and the availability of in person appointments. The patient expressed understanding and agreed to proceed.  Parties involved in encounter  Patient: Karen Sanchez  Provider:  Loura Pardon MD   History of Present Illness: Pt presents for f/u of dog bite   (broke up a dog interaction)  She had a bite last Thursday  Went to ER (note reviewed) and px augmentin   Back of her R hand is still swollen  The wound looks like it is healing  Some itching  A little redness -not much  A fair amt of pain with use of hand   One area -some blood  No pus or other d/c  Has abx -cannot take abx for a breath test on the 4th  Keeping wound very clean , peroxide and abx ointment  Area on thumb is healing slow   Feeling fine otherwise  No fever  Patient Active Problem List   Diagnosis Date Noted  . Dog bite 04/27/2020  . Elbow injury, initial encounter 11/03/2019  . Vaginitis 11/03/2019  . Right arm cellulitis 09/17/2019  . Laceration of thumb, right 09/02/2019  . Diarrhea 09/16/2018  . Headache 09/16/2018  . IBS (irritable bowel syndrome) 07/23/2016  . Gastritis 05/25/2016  . Depot contraception 07/12/2014  . Headache(784.0) 08/28/2013  . Routine general medical examination at a health care facility 03/11/2013  . Encounter for routine gynecological examination 03/11/2013  . 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 HYPOGLYCEMIA 07/12/2006   Past Medical History:  Diagnosis Date  . Anemia   . Anxiety   . Depression   . Encounter for initial prescription of injectable contraceptive 03/11/2017  . Fundic gland polyps of stomach, benign   .  Hypoglycemia    Past Surgical History:  Procedure Laterality Date  . INDUCED ABORTION     age 24   Social History   Tobacco Use  . Smoking status: Never Smoker  . Smokeless tobacco: Never Used  Vaping Use  . Vaping Use: Never used  Substance Use Topics  . Alcohol use: No    Alcohol/week: 0.0 standard drinks  . Drug use: No   Family History  Problem Relation Age of Onset  . Diabetes Mother   . Cancer Maternal Aunt        breast  . Cancer Paternal Aunt        breast  . Diabetes Father   . Diabetes Maternal Grandmother        paternal grandmother  . Breast cancer Maternal Aunt    Allergies  Allergen Reactions  . Citric Acid     Vaginal irritation   Current Outpatient Medications on File Prior to Visit  Medication Sig Dispense Refill  . desvenlafaxine (PRISTIQ) 100 MG 24 hr tablet Take 100 mg by mouth daily.    Marland Kitchen omeprazole (PRILOSEC) 20 MG capsule TAKE 1 CAPSULE BY MOUTH EVERY DAY 90 capsule 0  . promethazine (PHENERGAN) 25 MG tablet Take 1 tablet (25 mg total) by mouth every 8 (eight) hours as needed for nausea or vomiting. 30 tablet 3  . terconazole (TERAZOL 7) 0.4 % vaginal cream Apply to affected areas once daily 45 g 0  .  traZODone (DESYREL) 50 MG tablet Take 1 tablet by mouth at bedtime as needed.    Marland Kitchen amoxicillin-clavulanate (AUGMENTIN) 875-125 MG tablet Take 1 tablet by mouth 2 (two) times daily for 7 days. (Patient not taking: Reported on 04/27/2020) 14 tablet 0   No current facility-administered medications on file prior to visit.   Review of Systems  Constitutional: Negative for chills, fever and malaise/fatigue.  HENT: Negative for congestion, ear pain, sinus pain and sore throat.   Eyes: Negative for blurred vision, discharge and redness.  Respiratory: Negative for cough, shortness of breath and stridor.   Cardiovascular: Negative for chest pain, palpitations and leg swelling.  Gastrointestinal: Negative for abdominal pain, diarrhea, nausea and vomiting.   Musculoskeletal: Negative for myalgias.       Right hand pain and swelling for dog bite injury  Skin: Negative for rash.  Neurological: Negative for dizziness and headaches.      Observations/Objective: Patient appears well, in no distress Weight is baseline  No facial swelling or asymmetry Normal voice-not hoarse and no slurred speech No obvious tremor or mobility impairment R hand-some dorsal swelling noted  1 inch wound dorsal hand and smaller one on thenar area (scant if any redness)  Pt has full rom of hand and fingers but notes discomfort to make a fist   Moving neck and UEs normally Able to hear the call well  No cough or shortness of breath during interview  Talkative and mentally sharp with no cognitive changes No skin changes on face or neck , no rash or pallor Affect is normal    Assessment and Plan: Problem List Items Addressed This Visit      Other   Dog bite    Right hand  approx 1 inch dorsal wound and smaller puncture wound in thenar area  Mild swelling of hand  Pain to make a fist but able  Pt was seen in ER and augmentin px but she did not take in light of upcoming hydrogen breath test I am worried about infection (disc pasturella possible)  Adv to go ahead and take the augmentin (she will need to postpone her GI test)  Adv to clean with soap and water instead of peroxide Watch closely for increased redness or any drainage Also for inc pain or swelling  ER parameters discussed  F/u in office tomorrow or friday          Follow Up Instructions: Clean wounds with soap and water instead of peroxide  Elevate hand when you can to help swelling  If worse (increased redness or swelling or pain or any red streaks then alert [place] and go to urgent care or ER)  Take the augmentin as directed for infection  Sorry you will have to re schedule your GI testing   The office will contact you to schedule an in person visit in the next 1-2 days    I discussed the  assessment and treatment plan with the patient. The patient was provided an opportunity to ask questions and all were answered. The patient agreed with the plan and demonstrated an understanding of the instructions.   The patient was advised to call back or seek an in-person evaluation if the symptoms worsen or if the condition fails to improve as anticipated.     Loura Pardon, MD

## 2020-04-28 ENCOUNTER — Other Ambulatory Visit: Payer: Self-pay

## 2020-04-28 ENCOUNTER — Ambulatory Visit: Payer: Federal, State, Local not specified - PPO | Admitting: Family Medicine

## 2020-04-28 ENCOUNTER — Encounter: Payer: Self-pay | Admitting: Family Medicine

## 2020-04-28 VITALS — BP 110/66 | HR 108 | Temp 96.9°F | Ht 63.0 in | Wt 192.1 lb

## 2020-04-28 DIAGNOSIS — S61451S Open bite of right hand, sequela: Secondary | ICD-10-CM | POA: Diagnosis not present

## 2020-04-28 DIAGNOSIS — W540XXS Bitten by dog, sequela: Secondary | ICD-10-CM

## 2020-04-28 NOTE — Patient Instructions (Signed)
Continue the augmentin  Keep wounds clean with soap and water  Ice as needed // can try heat also  Elevate your hand when you can to help the swelling   Call and check in tomorrow please  If worse at any time please let us know

## 2020-04-28 NOTE — Progress Notes (Signed)
Subjective:    Patient ID: Karen Sanchez, female    DOB: Aug 01, 1985, 35 y.o.   MRN: [id number]  This visit occurred during the SARS-CoV-2 public health emergency.  Safety protocols were in place, including screening questions prior to the visit, additional usage of staff PPE, and extensive cleaning of exam room while observing appropriate contact time as indicated for disinfecting solutions.    HPI Pt presents for re check of bog bite (ER visit and virtual visit yesterday)  Wt Readings from Last 3 Encounters:  04/28/20 192 lb 2 oz (87.1 kg)  04/21/20 180 lb (81.6 kg)  11/03/19 191 lb (86.6 kg)   34.03 kg/m  Pt was initially px augmentin in ER (she did not start until yesterday)  R hand  Dorsal and thenar area  Still swollen and sore yesterday   No worse No change  No fever  No inc in redness   Slow progress in bending fingers  Cannot do her job   DG Hand Complete Right  Result Date: 04/21/2020 CLINICAL DATA:  Dog bite.  Pain and swelling.  Laceration. EXAM: RIGHT HAND - COMPLETE 3+ VIEW COMPARISON:  Radiograph 11/16/2015 FINDINGS: There is no evidence of fracture or dislocation. There is no evidence of arthropathy or other focal bone abnormality. Soft tissue edema and occasional air overlies the dorsum of the hand. No radiopaque foreign body. IMPRESSION: Soft tissue edema and occasional air overlies the dorsum of the hand. No radiopaque foreign body or osseous abnormality. Electronically Signed   By: Keith Rake M.D.   On: 04/21/2020 18:06    Patient Active Problem List   Diagnosis Date Noted  . Dog bite 04/27/2020  . Elbow injury, initial encounter 11/03/2019  . Vaginitis 11/03/2019  . Right arm cellulitis 09/17/2019  . Laceration of thumb, right 09/02/2019  . Diarrhea 09/16/2018  . Headache 09/16/2018  . IBS (irritable bowel syndrome) 07/23/2016  . Gastritis 05/25/2016  . Depot contraception 07/12/2014  . Headache(784.0) 08/28/2013  . Routine general  medical examination at a health care facility 03/11/2013  . Encounter for routine gynecological examination 03/11/2013  . 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 HYPOGLYCEMIA 07/12/2006   Past Medical History:  Diagnosis Date  . Anemia   . Anxiety   . Depression   . Encounter for initial prescription of injectable contraceptive 03/11/2017  . Fundic gland polyps of stomach, benign   . Hypoglycemia    Past Surgical History:  Procedure Laterality Date  . INDUCED ABORTION     age 79   Social History   Tobacco Use  . Smoking status: Never Smoker  . Smokeless tobacco: Never Used  Vaping Use  . Vaping Use: Never used  Substance Use Topics  . Alcohol use: No    Alcohol/week: 0.0 standard drinks  . Drug use: No   Family History  Problem Relation Age of Onset  . Diabetes Mother   . Cancer Maternal Aunt        breast  . Cancer Paternal Aunt        breast  . Diabetes Father   . Diabetes Maternal Grandmother        paternal grandmother  . Breast cancer Maternal Aunt    Allergies  Allergen Reactions  . Citric Acid     Vaginal irritation   Current Outpatient Medications on File Prior to Visit  Medication Sig Dispense Refill  . amoxicillin-clavulanate (AUGMENTIN) 875-125 MG tablet Take 1 tablet by mouth 2 (two) times daily for 7  days. 14 tablet 0  . cholestyramine (QUESTRAN) 4 g packet Take 4 g by mouth in the morning and at bedtime.    Marland Kitchen desvenlafaxine (PRISTIQ) 100 MG 24 hr tablet Take 100 mg by mouth daily.    Marland Kitchen omeprazole (PRILOSEC) 20 MG capsule TAKE 1 CAPSULE BY MOUTH EVERY DAY 90 capsule 0  . promethazine (PHENERGAN) 25 MG tablet Take 1 tablet (25 mg total) by mouth every 8 (eight) hours as needed for nausea or vomiting. 30 tablet 3  . terconazole (TERAZOL 7) 0.4 % vaginal cream Apply to affected areas once daily 45 g 0  . traZODone (DESYREL) 50 MG tablet Take 1 tablet by mouth at bedtime as needed.     No current facility-administered medications on file prior  to visit.    Review of Systems  Constitutional: Negative for activity change, appetite change, fatigue, fever and unexpected weight change.  HENT: Negative for congestion, ear pain, rhinorrhea, sinus pressure and sore throat.   Eyes: Negative for pain, redness and visual disturbance.  Respiratory: Negative for cough, shortness of breath and wheezing.   Cardiovascular: Negative for chest pain and palpitations.  Gastrointestinal: Negative for abdominal pain, blood in stool, constipation and diarrhea.  Endocrine: Negative for polydipsia and polyuria.  Genitourinary: Negative for dysuria, frequency and urgency.  Musculoskeletal: Negative for arthralgias, back pain and myalgias.       R hand swelling and pain  Skin: Positive for wound. Negative for pallor and rash.  Allergic/Immunologic: Negative for environmental allergies.  Neurological: Negative for dizziness, syncope and headaches.  Hematological: Negative for adenopathy. Does not bruise/bleed easily.  Psychiatric/Behavioral: Negative for decreased concentration and dysphoric mood. The patient is not nervous/anxious.        Objective:   Physical Exam Constitutional:      General: She is not in acute distress.    Appearance: Normal appearance. She is obese. She is not ill-appearing.  Eyes:     General: No scleral icterus.    Conjunctiva/sclera: Conjunctivae normal.     Pupils: Pupils are equal, round, and reactive to light.  Cardiovascular:     Rate and Rhythm: Regular rhythm.  Musculoskeletal:     Cervical back: Normal range of motion and neck supple. No tenderness.     Comments: Dorsal swelling R hand  Can make a gentle fist with discomfort  Nl strength , nl sensation and perfusion   No bony tenderness  Lymphadenopathy:     Cervical: No cervical adenopathy.  Skin:    General: Skin is warm and dry.     Coloration: Skin is not pale.     Findings: No erythema or rash.     Comments: Puncture wound dorsal R hand with  granulation tissue and no erythema (over an area of swelling but not fluctuance)  Puncture wound thenar area - with scab/no drainage or erythema  Abrasion of web space between 1,2 digits -scab  Mild tenderness over wounds  No erythema or streaking   Neurological:     Mental Status: She is alert.     Sensory: No sensory deficit.     Motor: No weakness.  Psychiatric:        Mood and Affect: Mood normal.           Assessment & Plan:   Problem List Items Addressed This Visit      Other   Dog bite - Primary    Swelling with dorsal R hand wound  Some pain with movement of hand /fingers (  per pt improved) Not a lot of tenderness No erythema or fluctuance Adv to take abx (augmentin) as directed Ice/elevate  Watch closely for erythema or inc swelling or pain  Rev xray and ER note-reassuring  Hoping for more improvement  inst to call and update tomorrow

## 2020-04-28 NOTE — Assessment & Plan Note (Signed)
Swelling with dorsal R hand wound  Some pain with movement of hand /fingers (per pt improved) Not a lot of tenderness No erythema or fluctuance Adv to take abx (augmentin) as directed Ice/elevate  Watch closely for erythema or inc swelling or pain  Rev xray and ER note-reassuring  Hoping for more improvement  inst to call and update tomorrow

## 2020-04-29 ENCOUNTER — Ambulatory Visit: Payer: Federal, State, Local not specified - PPO | Admitting: Family Medicine

## 2020-05-02 ENCOUNTER — Encounter: Payer: Self-pay | Admitting: Family Medicine

## 2020-05-02 DIAGNOSIS — W540XXS Bitten by dog, sequela: Secondary | ICD-10-CM

## 2020-05-03 ENCOUNTER — Encounter: Payer: Self-pay | Admitting: Family Medicine

## 2020-05-03 NOTE — Telephone Encounter (Signed)
Urgent hand specialist referral  Thanks

## 2020-05-05 ENCOUNTER — Telehealth: Payer: Self-pay | Admitting: Family Medicine

## 2020-05-05 ENCOUNTER — Ambulatory Visit (INDEPENDENT_AMBULATORY_CARE_PROVIDER_SITE_OTHER)
Admission: RE | Admit: 2020-05-05 | Discharge: 2020-05-05 | Disposition: A | Discharge status: Home / Self Care | Payer: Federal, State, Local not specified - PPO | Source: Ambulatory Visit | Attending: Family Medicine | Admitting: Family Medicine

## 2020-05-05 DIAGNOSIS — W540XXS Bitten by dog, sequela: Secondary | ICD-10-CM

## 2020-05-05 DIAGNOSIS — S61451A Open bite of right hand, initial encounter: Secondary | ICD-10-CM | POA: Diagnosis not present

## 2020-05-05 DIAGNOSIS — M79641 Pain in right hand: Secondary | ICD-10-CM

## 2020-05-05 NOTE — Telephone Encounter (Signed)
Left VM requesting pt to call the office back, also sent mychart message asking pt if she can come in for a repeat xray

## 2020-05-05 NOTE — Telephone Encounter (Signed)
Xray order for hand

## 2020-05-05 NOTE — Telephone Encounter (Signed)
-----   Message from Abner Greenspan, MD sent at 05/02/2020  8:21 PM EST ----- Ref for hand surg

## 2020-05-05 NOTE — Telephone Encounter (Signed)
Pt is coming in for xrays today please put orders in

## 2020-05-09 ENCOUNTER — Encounter: Payer: Self-pay | Admitting: Physician Assistant

## 2020-05-09 ENCOUNTER — Ambulatory Visit: Payer: Federal, State, Local not specified - PPO | Admitting: Physician Assistant

## 2020-05-09 DIAGNOSIS — S61431A Puncture wound without foreign body of right hand, initial encounter: Secondary | ICD-10-CM | POA: Diagnosis not present

## 2020-05-09 DIAGNOSIS — W540XXD Bitten by dog, subsequent encounter: Secondary | ICD-10-CM | POA: Diagnosis not present

## 2020-05-09 MED ORDER — AMOXICILLIN-POT CLAVULANATE 875-125 MG PO TABS
1.0000 | ORAL_TABLET | Freq: Two times a day (BID) | ORAL | 0 refills | Status: DC
Start: 2020-05-09 — End: 2020-07-06

## 2020-05-09 NOTE — Progress Notes (Signed)
-   Office Visit Note   Patient: Karen Sanchez           Date of Birth: 06-Mar-1986           MRN: [id number] Visit Date: 05/09/2020              Requested by: Tower, Wynelle Fanny, MD [place],  [place] [address] PCP: Abner Greenspan, MD   Assessment & Plan: Visit Diagnoses:  1. Dog bite, subsequent encounter     Plan: We will send her to formal physical therapy to work on range of motion of the hand and wrist.  Prescriptions given for [facility] which she will contact and make arrangements for initial evaluation.  Also placed her back on the Augmentin twice daily for another 7 days.  Have her soak the hand in Dial soap soaks warm water to work on range of motion also help heal the dorsal wound.  See her back in just 2 weeks see what type of improvement she has had.  Questions encouraged and answered.  Per patient's request we will have her return to work on 05/10/2020 full duties without restrictions.  She will call us if she has any problems with returning to work.   Follow-Up Instructions: Return in about 2 weeks (around 05/23/2020).   Orders:  No orders of the defined types were placed in this encounter.  Meds ordered this encounter  Medications  . amoxicillin-clavulanate (AUGMENTIN) 875-125 MG tablet    Sig: Take 1 tablet by mouth 2 (two) times daily.    Dispense:  14 tablet    Refill:  0      Procedures: No procedures performed   Clinical Data: No additional findings.   Subjective: Chief Complaint  Patient presents with  . Right Hand - Pain    HPI Mrs. Karen Sanchez is a 35 year old female comes in today for right hand pain status post dog bite on 04/21/2020.  She was seen on the date of the injury in the ER and was given Augmentin.  She had x-rays at that time and then repeat x-rays on 04/30/2020 which showed no acute fractures no subluxations dislocations.  I personally reviewed the films.  She is having pain mostly involving the long finger  through the fifth finger right hand particularly the dorsal aspect of the hand.  She denies any fever chills.  She denies any numbness tingling in the hand.  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  Review of Systems See HPI otherwise negative or noncontributory.  Objective: Vital Signs: There were no vitals taken for this visit.  Physical Exam Constitutional:      Appearance: She is not ill-appearing or diaphoretic.  Cardiovascular:     Pulses: Normal pulses.  Neurological:     Mental Status: She is alert and oriented to person, place, and time.  Psychiatric:        Mood and Affect: Mood normal.     Ortho Exam Bilateral hands slight swelling of the right hand compared to left particular dorsal aspect.  There is no abnormal warmth erythema of either hand.  She has 3 puncture wounds to the 2 on the palmar aspect they are almost completely healed and a third wound on the dorsal aspect mid hand that is healing well without any signs of infection.  She has full range of motion of the right hand with distal limits of the extremes due to pain.  She is able to  fully extend and flex the fingers with discomfort dorsal aspect of the right hand only.  Strength are 5 out of 5 throughout the hand against resistance.  Specialty Comments:  No specialty comments available.  Imaging: No results found.   PMFS History: Patient Active Problem List   Diagnosis Date Noted  . Dog bite 04/27/2020  . Elbow injury, initial encounter 11/03/2019  . Vaginitis 11/03/2019  . Right arm cellulitis 09/17/2019  . Laceration of thumb, right 09/02/2019  . Diarrhea 09/16/2018  . Headache 09/16/2018  . IBS (irritable bowel syndrome) 07/23/2016  . Gastritis 05/25/2016  . Right hand pain 11/16/2015  . Depot contraception 07/12/2014  . Headache(784.0) 08/28/2013  . Routine general medical examination at a health care facility 03/11/2013  . Encounter for routine gynecological examination 03/11/2013  .  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 HYPOGLYCEMIA 07/12/2006   Past Medical History:  Diagnosis Date  . Anemia   . Anxiety   . Depression   . Encounter for initial prescription of injectable contraceptive 03/11/2017  . Fundic gland polyps of stomach, benign   . Hypoglycemia     Family History  Problem Relation Age of Onset  . Diabetes Mother   . Cancer Maternal Aunt        breast  . Cancer Paternal Aunt        breast  . Diabetes Father   . Diabetes Maternal Grandmother        paternal grandmother  . Breast cancer Maternal Aunt     Past Surgical History:  Procedure Laterality Date  . INDUCED ABORTION     age 37   Social History   Occupational History  . Occupation: [employer]  Tobacco Use  . Smoking status: Never Smoker  . Smokeless tobacco: Never Used  Vaping Use  . Vaping Use: Never used  Substance and Sexual Activity  . Alcohol use: No    Alcohol/week: 0.0 standard drinks  . Drug use: No  . Sexual activity: Not on file

## 2020-05-23 DIAGNOSIS — F4322 Adjustment disorder with anxiety: Secondary | ICD-10-CM | POA: Diagnosis not present

## 2020-05-24 DIAGNOSIS — K219 Gastro-esophageal reflux disease without esophagitis: Secondary | ICD-10-CM | POA: Diagnosis not present

## 2020-05-24 DIAGNOSIS — K2970 Gastritis, unspecified, without bleeding: Secondary | ICD-10-CM | POA: Diagnosis not present

## 2020-05-24 DIAGNOSIS — Z8719 Personal history of other diseases of the digestive system: Secondary | ICD-10-CM | POA: Diagnosis not present

## 2020-05-24 DIAGNOSIS — R112 Nausea with vomiting, unspecified: Secondary | ICD-10-CM | POA: Diagnosis not present

## 2020-05-24 DIAGNOSIS — R1010 Upper abdominal pain, unspecified: Secondary | ICD-10-CM | POA: Diagnosis not present

## 2020-05-24 DIAGNOSIS — K529 Noninfective gastroenteritis and colitis, unspecified: Secondary | ICD-10-CM | POA: Diagnosis not present

## 2020-05-24 DIAGNOSIS — Z79899 Other long term (current) drug therapy: Secondary | ICD-10-CM | POA: Diagnosis not present

## 2020-05-24 DIAGNOSIS — R197 Diarrhea, unspecified: Secondary | ICD-10-CM | POA: Diagnosis not present

## 2020-05-24 DIAGNOSIS — K317 Polyp of stomach and duodenum: Secondary | ICD-10-CM | POA: Diagnosis not present

## 2020-06-15 ENCOUNTER — Ambulatory Visit: Payer: Federal, State, Local not specified - PPO

## 2020-06-20 ENCOUNTER — Encounter: Payer: Self-pay | Admitting: Family Medicine

## 2020-06-20 ENCOUNTER — Telehealth: Payer: Self-pay | Admitting: Family Medicine

## 2020-06-20 MED ORDER — PROMETHAZINE HCL 25 MG PO TABS: 25.0000 mg | ORAL_TABLET | Freq: Three times a day (TID) | ORAL | 3 refills | Status: DC | PRN

## 2020-06-20 NOTE — Telephone Encounter (Signed)
Pt had last shot on 03/23/20, looking at window of time for next one it should be between 06/08/20 - 06/22/20  If she can not get depo during that window she would have to have a urine pregnancy test, come back a week later and get a 2nd one and if the 2nd one is negative then she can get her depo, will route to [facility] for scheduling

## 2020-06-20 NOTE — Telephone Encounter (Signed)
I left a message on patient's voice mail to return my call.  Patient needs to schedule depo shot.

## 2020-06-20 NOTE — Telephone Encounter (Signed)
Patient called in stating she canceled her depo on the 9th and needed to reschedule. Patient inquring what her window is and when she can have it done as next time is showing 29th.

## 2020-06-20 NOTE — Telephone Encounter (Signed)
See mychart. I sent pt info regarding scheduling depo shot. Please see refill request. Last filled on 09/03/18 #30 with 3 refills

## 2020-06-22 ENCOUNTER — Other Ambulatory Visit: Payer: Self-pay

## 2020-06-22 ENCOUNTER — Ambulatory Visit: Payer: Federal, State, Local not specified - PPO

## 2020-07-06 ENCOUNTER — Other Ambulatory Visit: Payer: Self-pay

## 2020-07-06 ENCOUNTER — Encounter: Payer: Self-pay | Admitting: Family Medicine

## 2020-07-06 ENCOUNTER — Ambulatory Visit: Payer: Federal, State, Local not specified - PPO | Admitting: Family Medicine

## 2020-07-06 VITALS — BP 110/70 | HR 102 | Temp 97.5°F | Ht 63.0 in | Wt 192.2 lb

## 2020-07-06 DIAGNOSIS — N920 Excessive and frequent menstruation with regular cycle: Secondary | ICD-10-CM

## 2020-07-06 DIAGNOSIS — Z308 Encounter for other contraceptive management: Secondary | ICD-10-CM

## 2020-07-06 LAB — POCT URINE PREGNANCY: Preg Test, Ur: NEGATIVE

## 2020-07-06 NOTE — Progress Notes (Signed)
Subjective:    Patient ID: Karen Sanchez, female    DOB: 05-21-1985, 35 y.o.   MRN: [id number]  This visit occurred during the SARS-CoV-2 public health emergency.  Safety protocols were in place, including screening questions prior to the visit, additional usage of staff PPE, and extensive cleaning of exam room while observing appropriate contact time as indicated for disinfecting solutions.    HPI Pt presents to discuss medication  Also needs updated note to have a fan at work    [employer] from Last 3 Encounters:  07/06/20 192 lb 4 oz (87.2 kg)  04/28/20 192 lb 2 oz (87.1 kg)  04/21/20 180 lb (81.6 kg)   34.06 kg/m  Pt has been taking depo for a while  Hard to get here for the shot  Does not get periods at all   Work hours are long Cannot remember to take a pill daily  Is interested in an implant or IUD (low mt)   Took OC in the past- had some issues from that and also issues with compliance   Wants to do depo shot today to get by until a gyn appt   BP Readings from Last 3 Encounters:  07/06/20 110/70  04/28/20 110/66  04/21/20 (!) 133/91   Pulse Readings from Last 3 Encounters:  07/06/20 (!) 102  04/28/20 (!) 108  04/21/20 90   Nl pap and HPV screen in 2020   Very heavy periods and headaches w/o any hormonal contraception   Patient Active Problem List   Diagnosis Date Noted  . Heavy menses 07/06/2020  . Dog bite 04/27/2020  . Elbow injury, initial encounter 11/03/2019  . Vaginitis 11/03/2019  . Right arm cellulitis 09/17/2019  . Laceration of thumb, right 09/02/2019  . Headache 09/16/2018  . IBS (irritable bowel syndrome) 07/23/2016  . Gastritis 05/25/2016  . Right hand pain 11/16/2015  . Depot contraception 07/12/2014  . Headache(784.0) 08/28/2013  . Routine general medical examination at a health care facility 03/11/2013  . Encounter for routine gynecological examination 03/11/2013  . 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  HYPOGLYCEMIA 07/12/2006   Past Medical History:  Diagnosis Date  . Anemia   . Anxiety   . Depression   . Encounter for initial prescription of injectable contraceptive 03/11/2017  . Fundic gland polyps of stomach, benign   . Hypoglycemia    Past Surgical History:  Procedure Laterality Date  . INDUCED ABORTION     age 77   Social History   Tobacco Use  . Smoking status: Never Smoker  . Smokeless tobacco: Never Used  Vaping Use  . Vaping Use: Never used  Substance Use Topics  . Alcohol use: No    Alcohol/week: 0.0 standard drinks  . Drug use: No   Family History  Problem Relation Age of Onset  . Diabetes Mother   . Cancer Maternal Aunt        breast  . Cancer Paternal Aunt        breast  . Diabetes Father   . Diabetes Maternal Grandmother        paternal grandmother  . Breast cancer Maternal Aunt    Allergies  Allergen Reactions  . Citric Acid     Vaginal irritation   Current Outpatient Medications on File Prior to Visit  Medication Sig Dispense Refill  . cholestyramine (QUESTRAN) 4 g packet Take 4 g by mouth in the morning and at bedtime.    Marland Kitchen desvenlafaxine (PRISTIQ) 100 MG  24 hr tablet Take 100 mg by mouth daily.    Marland Kitchen omeprazole (PRILOSEC) 20 MG capsule TAKE 1 CAPSULE BY MOUTH EVERY DAY 90 capsule 0  . promethazine (PHENERGAN) 25 MG tablet Take 1 tablet (25 mg total) by mouth every 8 (eight) hours as needed for nausea or vomiting. 30 tablet 3  . terconazole (TERAZOL 7) 0.4 % vaginal cream Apply to affected areas once daily 45 g 0  . traZODone (DESYREL) 50 MG tablet Take 1 tablet by mouth at bedtime as needed.     No current facility-administered medications on file prior to visit.    Review of Systems  Constitutional: Negative for activity change, appetite change, fatigue, fever and unexpected weight change.  HENT: Negative for congestion, ear pain, rhinorrhea, sinus pressure and sore throat.   Eyes: Negative for pain, redness and visual disturbance.   Respiratory: Negative for cough, shortness of breath and wheezing.   Cardiovascular: Negative for chest pain and palpitations.  Gastrointestinal: Negative for abdominal pain, blood in stool, constipation and diarrhea.  Endocrine: Negative for polydipsia and polyuria.  Genitourinary: Positive for menstrual problem. Negative for dysuria, frequency and urgency.  Musculoskeletal: Negative for arthralgias, back pain and myalgias.  Skin: Negative for pallor and rash.  Allergic/Immunologic: Negative for environmental allergies.  Neurological: Negative for dizziness, syncope and headaches.  Hematological: Negative for adenopathy. Does not bruise/bleed easily.  Psychiatric/Behavioral: Negative for decreased concentration and dysphoric mood. The patient is not nervous/anxious.        Objective:   Physical Exam Constitutional:      General: She is not in acute distress.    Appearance: Normal appearance. She is obese. She is not ill-appearing.  Eyes:     General: No scleral icterus.    Conjunctiva/sclera: Conjunctivae normal.     Pupils: Pupils are equal, round, and reactive to light.  Cardiovascular:     Rate and Rhythm: Regular rhythm. Tachycardia present.  Pulmonary:     Effort: Pulmonary effort is normal. No respiratory distress.  Abdominal:     General: Bowel sounds are normal. There is no distension.     Palpations: There is no mass.     Comments: No suprapubic tenderness or fullness    Musculoskeletal:     Cervical back: No rigidity.  Skin:    General: Skin is warm and dry.     Coloration: Skin is not jaundiced or pale.  Neurological:     Mental Status: She is alert.  Psychiatric:        Mood and Affect: Mood normal.           Assessment & Plan:   Problem List Items Addressed This Visit      Other   Heavy menses - Primary    Pt has done well with depo provera to stop menses but now difficult to get to the office for her shot  Would like to consider IUD or other form  of contraception besides the pill  Info given o progesterone IUD and implant Will ref to gyn to discuss       Relevant Orders   Ambulatory referral to Gynecology    Other Visit Diagnoses    Encounter for other contraceptive management       Relevant Orders   POCT urine pregnancy (Completed)

## 2020-07-06 NOTE — Assessment & Plan Note (Signed)
Pt has done well with depo provera to stop menses but now difficult to get to the office for her shot  Would like to consider IUD or other form of contraception besides the pill  Info given o progesterone IUD and implant Will ref to gyn to discuss

## 2020-07-06 NOTE — Patient Instructions (Signed)
I placed a gyn referral  The office will call   Depo shot today

## 2020-07-18 ENCOUNTER — Ambulatory Visit (INDEPENDENT_AMBULATORY_CARE_PROVIDER_SITE_OTHER): Payer: Federal, State, Local not specified - PPO

## 2020-07-18 ENCOUNTER — Other Ambulatory Visit: Payer: Self-pay

## 2020-07-18 DIAGNOSIS — Z308 Encounter for other contraceptive management: Secondary | ICD-10-CM

## 2020-07-18 LAB — POCT URINE PREGNANCY: Preg Test, Ur: NEGATIVE

## 2020-07-18 MED ORDER — MEDROXYPROGESTERONE ACETATE 150 MG/ML IM SUSP
150.0000 mg | Freq: Once | INTRAMUSCULAR | Status: AC
  Administered 2020-07-18: 150 mg via INTRAMUSCULAR

## 2020-07-18 NOTE — Progress Notes (Signed)
Per orders of Dr. Glori Bickers, injection of Depo, given by Aneta Mins, RN. Patient tolerated injection well in [place]. Second pregnancy test was negative.

## 2020-07-23 DIAGNOSIS — F4310 Post-traumatic stress disorder, unspecified: Secondary | ICD-10-CM | POA: Diagnosis not present

## 2020-07-23 DIAGNOSIS — F410 Panic disorder [episodic paroxysmal anxiety] without agoraphobia: Secondary | ICD-10-CM | POA: Diagnosis not present

## 2020-08-12 ENCOUNTER — Encounter: Payer: Self-pay | Admitting: Family Medicine

## 2020-08-15 NOTE — Telephone Encounter (Signed)
Will route to PCP and also Tam (in charge of FMLA)

## 2020-08-16 ENCOUNTER — Ambulatory Visit: Payer: Federal, State, Local not specified - PPO | Admitting: Certified Nurse Midwife

## 2020-08-16 ENCOUNTER — Other Ambulatory Visit: Payer: Self-pay

## 2020-08-16 ENCOUNTER — Encounter: Payer: Self-pay | Admitting: Certified Nurse Midwife

## 2020-08-16 VITALS — BP 117/78 | HR 80 | Resp 16 | Ht 63.0 in | Wt 189.4 lb

## 2020-08-16 DIAGNOSIS — Z3009 Encounter for other general counseling and advice on contraception: Secondary | ICD-10-CM | POA: Diagnosis not present

## 2020-08-16 NOTE — Patient Instructions (Signed)

## 2020-08-16 NOTE — Telephone Encounter (Signed)
LVM for pt to call me about FMLA paperwork

## 2020-08-16 NOTE — Progress Notes (Signed)
Subjective:    Karen Sanchez is a 35 y.o. female who presents for contraception counseling. The patient has no complaints today. The patient is sexually active. Pertinent past medical history: none.  Menstrual History: OB History   No obstetric history on file.     No LMP recorded. Patient has had an injection.    The following portions of the patient's history were reviewed and updated as appropriate: allergies, current medications, past family history, past medical history, past social history, past surgical history and problem list.  Review of Systems Pertinent items are noted in HPI.   Objective:    No exam performed today, not indicated to discuss BC.   Assessment:    35 y.o., interested in IUD , She is currently on depo injections  Plan:    Pt state she is having difficulty getting in for the injections. SHe is intersted in a IUD.   Reviewed types of IUD including mirena, kyleena, skyla, liletta, and para guard. Discussed procedure for placement. She states she need BC for cycle control for for [facility]. Encouraged use of Advil prior to placement due to cramping. Discussed risks, benefits, and length each IUD is good for. Reviewed procedure for placement. She verbalizes and agrees. Follow up prn for placement.   Philip Aspen, CNM

## 2020-08-16 NOTE — Telephone Encounter (Signed)
Printed and given to [employer]

## 2020-08-16 NOTE — Telephone Encounter (Signed)
Semi completed FMLA paperwork placed in PCP's inbox for reveiwe, completion, sign and date  Pt state IBS/GI/gastritis for symptoms

## 2020-08-19 NOTE — Telephone Encounter (Signed)
LVM for pt to call me, I have one more question before completing her FMLA paperwork

## 2020-08-19 NOTE — Telephone Encounter (Signed)
Spoke w pt to inform that paperwork was completed and faxed  Copy mailed to pt  Copy for scan   Copy retained by me

## 2020-08-26 ENCOUNTER — Other Ambulatory Visit: Payer: Self-pay | Admitting: Family Medicine

## 2020-08-26 ENCOUNTER — Other Ambulatory Visit (INDEPENDENT_AMBULATORY_CARE_PROVIDER_SITE_OTHER): Payer: Federal, State, Local not specified - PPO

## 2020-08-26 ENCOUNTER — Telehealth (INDEPENDENT_AMBULATORY_CARE_PROVIDER_SITE_OTHER): Payer: Federal, State, Local not specified - PPO | Admitting: Family Medicine

## 2020-08-26 ENCOUNTER — Other Ambulatory Visit: Payer: Self-pay

## 2020-08-26 VITALS — Wt 195.0 lb

## 2020-08-26 DIAGNOSIS — R053 Chronic cough: Secondary | ICD-10-CM | POA: Diagnosis not present

## 2020-08-26 DIAGNOSIS — Z20822 Contact with and (suspected) exposure to covid-19: Secondary | ICD-10-CM | POA: Insufficient documentation

## 2020-08-26 LAB — POC INFLUENZA A&B (BINAX/QUICKVUE)
Influenza A, POC: NEGATIVE
Influenza B, POC: NEGATIVE

## 2020-08-26 MED ORDER — BENZONATATE 200 MG PO CAPS
200.0000 mg | ORAL_CAPSULE | Freq: Two times a day (BID) | ORAL | 0 refills | Status: DC | PRN
Start: 2020-08-26 — End: 2020-12-13

## 2020-08-26 NOTE — Assessment & Plan Note (Signed)
Via husband.. send for testing.

## 2020-08-26 NOTE — Patient Instructions (Signed)
Add nasal saline irrigation or spray.  Start Zyrtec at bedtime.  Can use benzonate for cough prn.  Come for testing as scheduled.

## 2020-08-26 NOTE — Assessment & Plan Note (Signed)
Possibly worsen due to new COVID infection... test for COVID and flu today at [facility].  Quarantine until results return.   Chronic cough may be from allergies.   Add nasal saline irrigation or spray.  Start Zyrtec at bedtime.  Can use benzonate for cough prn.  Come for testing as scheduled.

## 2020-08-26 NOTE — Progress Notes (Signed)
VIRTUAL VISIT Due to national recommendations of social distancing due to [place] 19, a virtual visit is felt to be most appropriate for this patient at this time.   I connected with the patient on 08/26/20 at  9:00 AM EDT by virtual telehealth platform and verified that I am speaking with the correct person using two identifiers.   I discussed the limitations, risks, security and privacy concerns of performing an evaluation and management service by  virtual telehealth platform and the availability of in person appointments. I also discussed with the patient that there may be a patient responsible charge related to this service. The patient expressed understanding and agreed to proceed.  Patient location: Home Provider Location: [facility] Participants: Eliezer Lofts and [place]   Chief Complaint  Patient presents with  . Covid Exposure    At home test was negative yesterday. Husband tested positive yesterday. Pt is scheduled for curbside testing today at 2:30.   . Cough  . Headache    History of Present Illness:  35 year old female with obesity presents with new onset cough and headache.  She reports exposure to [place] via  Husband... tested positive yesterday  Date of onset :  Has some chronic cough and headache for the last 2 month. Slightly worse in last 2 weeks. Dry cough No fever, no body ache  no SOB No congestion   She has been using Claritin D, nyquil dayquil, sudafed.  Nonsmoker, no chronic lung disease. No immunocompromised.  Body mass index is 34.54 kg/m.   COVID 19 screen COVID testing: negative 5/19/ home test COVID vaccine:  03/28/2020 [place] COVID-19 Vaccine 07/13/2019 , 06/21/2019   COVID exposure: No recent travel or known exposure to [place]  The importance of social distancing was discussed today.    Review of Systems  Constitutional: Negative for chills and fever.  HENT: Negative for congestion and ear pain.   Eyes:  Negative for pain and redness.  Respiratory: Positive for cough. Negative for shortness of breath.   Cardiovascular: Negative for chest pain, palpitations and leg swelling.  Gastrointestinal: Negative for abdominal pain, blood in stool, constipation, diarrhea, nausea and vomiting.  Genitourinary: Negative for dysuria.  Musculoskeletal: Negative for falls and myalgias.  Skin: Negative for rash.  Neurological: Positive for headaches. Negative for dizziness.  Psychiatric/Behavioral: Negative for depression. The patient is not nervous/anxious.       Past Medical History:  Diagnosis Date  . Anemia   . Anxiety   . Depression   . Encounter for initial prescription of injectable contraceptive 03/11/2017  . Fundic gland polyps of stomach, benign   . Hypoglycemia     reports that she has never smoked. She has never used smokeless tobacco. She reports that she does not drink alcohol and does not use drugs.   Current Outpatient Medications:  .  cholestyramine (QUESTRAN) 4 g packet, Take 4 g by mouth in the morning and at bedtime., Disp: , Rfl:  .  clonazePAM (KLONOPIN) 1 MG tablet, Take 1 mg by mouth at bedtime as needed., Disp: , Rfl:  .  desvenlafaxine (PRISTIQ) 100 MG 24 hr tablet, Take 100 mg by mouth daily., Disp: , Rfl:  .  omeprazole (PRILOSEC) 20 MG capsule, TAKE 1 CAPSULE BY MOUTH EVERY DAY, Disp: 90 capsule, Rfl: 0 .  promethazine (PHENERGAN) 25 MG tablet, Take 1 tablet (25 mg total) by mouth every 8 (eight) hours as needed for nausea or vomiting., Disp: 30 tablet,  Rfl: 3 .  terconazole (TERAZOL 7) 0.4 % vaginal cream, Apply to affected areas once daily, Disp: 45 g, Rfl: 0 .  traZODone (DESYREL) 50 MG tablet, Take 1 tablet by mouth at bedtime as needed., Disp: , Rfl:    Observations/Objective: Weight 195 lb (88.5 kg).  Physical Exam  Physical Exam Constitutional:      General: The patient is not in acute distress. Pulmonary:     Effort: Pulmonary effort is normal. No respiratory  distress.  Neurological:     Mental Status: The patient is alert and oriented to person, place, and time.  Psychiatric:        Mood and Affect: Mood normal.        Behavior: Behavior normal.   Assessment and Plan Problem List Items Addressed This Visit    Close exposure to COVID-19 virus - Primary    Via husband.. send for testing.      Persistent cough for 3 weeks or longer    Possibly worsen due to new COVID infection... test for COVID and flu today at [facility].  Quarantine until results return.   Chronic cough may be from allergies.   Add nasal saline irrigation or spray.  Start Zyrtec at bedtime.  Can use benzonate for cough prn.  Come for testing as scheduled.            I discussed the assessment and treatment plan with the patient. The patient was provided an opportunity to ask questions and all were answered. The patient agreed with the plan and demonstrated an understanding of the instructions.   The patient was advised to call back or seek an in-person evaluation if the symptoms worsen or if the condition fails to improve as anticipated.     Eliezer Lofts, MD

## 2020-08-27 LAB — SARS-COV-2, NAA 2 DAY TAT

## 2020-08-27 LAB — NOVEL CORONAVIRUS, NAA: SARS-CoV-2, NAA: NOT DETECTED

## 2020-08-29 ENCOUNTER — Encounter: Payer: Federal, State, Local not specified - PPO | Admitting: Certified Nurse Midwife

## 2020-08-30 ENCOUNTER — Encounter: Payer: Self-pay | Admitting: Certified Nurse Midwife

## 2020-10-03 ENCOUNTER — Other Ambulatory Visit: Payer: Self-pay

## 2020-10-03 ENCOUNTER — Encounter: Payer: Self-pay | Admitting: Certified Nurse Midwife

## 2020-10-03 ENCOUNTER — Ambulatory Visit: Payer: Federal, State, Local not specified - PPO | Admitting: Certified Nurse Midwife

## 2020-10-03 VITALS — BP 121/93 | HR 97 | Ht 63.0 in | Wt 189.1 lb

## 2020-10-03 DIAGNOSIS — Z30430 Encounter for insertion of intrauterine contraceptive device: Secondary | ICD-10-CM

## 2020-10-03 NOTE — Progress Notes (Signed)
    GYNECOLOGY OFFICE PROCEDURE NOTE  Karen Sanchez is a 35 y.o.. here for Mirena IUD insertion. No GYN concerns.  Last pap smear was on 09/03/2018 and was normal, negative HPV.  IUD Insertion Procedure Note Patient identified, informed consent performed, consent signed.   Discussed risks of irregular bleeding, cramping, infection, malpositioning or misplacement of the IUD outside the uterus which may require further procedure such as laparoscopy. Also discussed >99% contraception efficacy, increased risk of ectopic pregnancy with failure of method.   Emphasized that this did not protect against STIs, condoms recommended during all sexual encounters. Time out was performed.  Urine pregnancy test negative.  Speculum placed in the vagina.  Cervix visualized.  Cleaned with Betadine x 2.  Grasped anteriorly with a single tooth tenaculum.  Uterus sounded to 6.5 cm.  Mirena IUD placed per manufacturer's recommendations.  Strings trimmed to 3 cm. Tenaculum was removed, good hemostasis noted.  Patient tolerated procedure well.   Patient was given post-procedure instructions.  She was advised to have backup contraception for one week.  Patient was also asked to check IUD strings periodically and follow up in 4 weeks for IUD check.   Philip Aspen, CNM  [facility]

## 2020-10-03 NOTE — Patient Instructions (Signed)

## 2020-10-31 ENCOUNTER — Other Ambulatory Visit: Payer: Self-pay

## 2020-10-31 ENCOUNTER — Ambulatory Visit: Payer: Federal, State, Local not specified - PPO | Admitting: Certified Nurse Midwife

## 2020-10-31 VITALS — BP 132/82 | HR 91 | Ht 63.0 in | Wt 190.0 lb

## 2020-10-31 DIAGNOSIS — Z30431 Encounter for routine checking of intrauterine contraceptive device: Secondary | ICD-10-CM | POA: Diagnosis not present

## 2020-10-31 NOTE — Patient Instructions (Signed)

## 2020-10-31 NOTE — Progress Notes (Signed)
    GYNECOLOGY OFFICE ENCOUNTER NOTE  History:  35 y.o. No obstetric history on file. here today for today for IUD string check; Mirena  IUD was placed  10/03/2020. No complaints about the IUD, no concerning side effects.  The following portions of the patient's history were reviewed and updated as appropriate: allergies, current medications, past family history, past medical history, past social history, past surgical history and problem list.  Review of Systems:  Pertinent items are noted in HPI.   Objective:  Physical Exam Blood pressure 132/82, pulse 91, height '5\' 3"'$  (1.6 m), weight 190 lb (86.2 kg). CONSTITUTIONAL: Well-developed, well-nourished female in no acute distress.  NEUROLOGIC: Alert and oriented to person, place, and time. Normal reflexes, muscle tone coordination.  PSYCHIATRIC: Normal mood and affect. Normal behavior. Normal judgment and thought content. CARDIOVASCULAR: Normal heart rate noted RESPIRATORY: Effort and breath sounds normal, no problems with respiration noted ABDOMEN: Soft, no distention noted.   PELVIC: Normal appearing external genitalia; normal appearing vaginal mucosa and cervix.  IUD strings initally not visualized. Forceps used to bring stings outside of cervix, about 1.5 cm in length outside cervix.  Assessment & Plan:  Patient to keep IUD in place for up to seven years; can come in for removal if she desires pregnancy earlier or for any concerning side effects.   Philip Aspen, CNM

## 2020-11-08 DIAGNOSIS — F4321 Adjustment disorder with depressed mood: Secondary | ICD-10-CM | POA: Diagnosis not present

## 2020-11-08 DIAGNOSIS — F410 Panic disorder [episodic paroxysmal anxiety] without agoraphobia: Secondary | ICD-10-CM | POA: Diagnosis not present

## 2020-11-08 DIAGNOSIS — F331 Major depressive disorder, recurrent, moderate: Secondary | ICD-10-CM | POA: Diagnosis not present

## 2020-12-09 ENCOUNTER — Telehealth: Payer: Self-pay | Admitting: Certified Nurse Midwife

## 2020-12-09 ENCOUNTER — Telehealth: Payer: Self-pay

## 2020-12-09 NOTE — Telephone Encounter (Signed)
Noted. Agree with appt next week. Should be okay to wait unless severe symptoms.. UC or ER otherwise as pt already told.

## 2020-12-09 NOTE — Telephone Encounter (Signed)
Spoke with patient in regards to pelvic pain. Patient reports cramps, discharge, odor, breast pain ongoing for 1 week. Informed patient that AT is out next week, patient states she would be okay seeing [place]. Patient to make an appointment to be seen in regards to symptoms.

## 2020-12-09 NOTE — Telephone Encounter (Signed)
Patient states that she is having some pelvic pain and thinks that she may have a infection.She stated that she is having some complications from her IUD and is not sure it is related...please advise

## 2020-12-09 NOTE — Telephone Encounter (Signed)
I spoke with pt;pt said for 4 days she has had abd pain that is sometimes sharpe pain and other times a dull crampy pain and pain comes and goes. Pt said she recently had IUD placed about 2 months ago and thinks this may be a reaction but OB GYN cannot see pt until 01/06/21. Raquel Sarna at front desk has already given pt an appt with Dr Diona Browner on 12/13/20 at 2:40. Pt also has vaginal discharge that is yellow but odorless. Pt said vagina itches and hurts to touch the vagina and this has been going on for 1 wk and pt was not sure if this was normal or not. I advised pt this was not normal. Pt also said she has breast pain when touches either breast. Pt is not having any fever. Pt is getting off work now and I advised pt is she is hurting badly with above symptoms she should go to ED for eval. Pt said she would talk with her husband and make sure her kids are taken care of and then pt may go to ED but does not want to cancel appt on 12/13/20. Sending note to Dr Diona Browner, Juluis Rainier to Dr Glori Bickers as PCP and Butch Penny CMA. Will send teams to [facility] and Dr Diona Browner.

## 2020-12-09 NOTE — Telephone Encounter (Signed)
[facility] - Client TELEPHONE ADVICE RECORD [facility] Patient Name: Karen Sanchez Gender: Female DOB: 1985/06/08 Age: 35 Y 5 D Return Phone Number: [phone] (Primary) Address[place] [address] Client [facility] - Client Client Site [place], Roque Lias - MD Contact Type Call Who Is Calling Patient / Member / Family / Caregiver Call Type Triage / Clinical Relationship To Patient Self Return Phone Number [phone] (Primary) Chief Complaint Abdominal Pain Reason for Call Symptomatic / Request for [facility] states that she has an IUD and she has pelvic pain and vaginal discharge. Translation No Nurse Assessment Nurse: Alba Destine, RN, Melony Overly Date/Time Eilene Ghazi Time): 12/09/2020 2:06:53 PM Confirm and document reason for call. If symptomatic, describe symptoms. ---Caller states that she has an IUD and she has pelvic pain and vaginal discharge. pain has pain, itching , breast are hurting. yellow white discharge. no bleeding. no fever . no vomiting. Does the patient have any new or worsening symptoms? ---Yes Will a triage be completed? ---Yes Related visit to physician within the last 2 weeks? ---No Does the PT have any chronic conditions? (i.e. diabetes, asthma, this includes High risk factors for pregnancy, etc.) ---No Is the patient pregnant or possibly pregnant? (Ask all females between the ages of 60-55) ---No Is this a behavioral health or substance abuse call? ---No Guidelines Guideline Title Affirmed Question Affirmed Notes Nurse Date/Time (Eastern Time) Vaginal Symptoms MODERATESEVERE itching (i.e., interferes with school, work, or sleep) Alba Destine, RN, Melony Overly 12/09/2020 2:09:44 PM Disp. Time Eilene Ghazi Time) Disposition Final User 12/09/2020 2:16:32 PM See PCP within 24 Hours Yes Alba Destine, RN,  Melony Overly PLEASE NOTE: All timestamps contained within this report are represented as [place] Standard Time. CONFIDENTIALTY NOTICE: This fax transmission is intended only for the addressee. It contains information that is legally privileged, confidential or otherwise protected from use or disclosure. If you are not the intended recipient, you are strictly prohibited from reviewing, disclosing, copying using or disseminating any of this information or taking any action in reliance on or regarding this information. If you have received this fax in error, please notify us immediately by telephone so that we can arrange for its return to [place]. Phone: [phone], Toll-Free: [phone], Fax: [phone] Page: 2 of 2 Call Id: [id number] [place] Disagree/Comply Comply Caller Understands Yes PreDisposition Did not know what to do Care Advice Given Per Guideline SEE PCP WITHIN 24 HOURS: * IF OFFICE WILL BE OPEN: You need to be examined within the next 24 hours. Call your doctor (or NP/PA) when the office opens and make an appointment. CLEANSING: * Wash the area once thoroughly with un-scented soap and water to remove any irritants. ANTIHISTAMINE MEDICINES FOR SEVERE ITCHING: * For severe itching, you can take either cetirizine, fexofenadine, or loratadine. * They are over-the-counter (OTC) antihistamine medicines. You can buy them at a drugstore or grocery store. * CETIRIZINE (REACTINE, ZYRTEC): The adult dose is 10 mg. You take it once a day. Cetirizine is available in the [place] as Zyrtec and in [place] as Reactine. * FEXOFENADINE (ALLEGRA): In the [place], the adult dose is one 24-hour tablet (180 mg) once a day. In [place], the adult dose is one 24-hour tablet (120 mg) once a day. Or, you can take one 12-hour (60 mg) tablet 2 times a day. CARE ADVICE given per Vaginal Symptoms (Adult) guideline. CALL BACK IF: * Fever or severe pain *  You become worse Referrals REFERRED TO PCP OFFICE

## 2020-12-13 ENCOUNTER — Other Ambulatory Visit: Payer: Self-pay

## 2020-12-13 ENCOUNTER — Ambulatory Visit: Payer: Federal, State, Local not specified - PPO | Admitting: Family Medicine

## 2020-12-13 ENCOUNTER — Ambulatory Visit: Payer: Federal, State, Local not specified - PPO | Admitting: Obstetrics and Gynecology

## 2020-12-13 ENCOUNTER — Other Ambulatory Visit (HOSPITAL_COMMUNITY)
Admission: RE | Admit: 2020-12-13 | Discharge: 2020-12-13 | Disposition: A | Discharge status: Home / Self Care | Payer: Federal, State, Local not specified - PPO | Source: Ambulatory Visit | Attending: Obstetrics and Gynecology | Admitting: Obstetrics and Gynecology

## 2020-12-13 ENCOUNTER — Encounter: Payer: Self-pay | Admitting: Obstetrics and Gynecology

## 2020-12-13 VITALS — BP 131/84 | HR 85 | Ht 63.0 in | Wt 185.2 lb

## 2020-12-13 DIAGNOSIS — N760 Acute vaginitis: Secondary | ICD-10-CM

## 2020-12-13 DIAGNOSIS — N644 Mastodynia: Secondary | ICD-10-CM | POA: Diagnosis not present

## 2020-12-13 DIAGNOSIS — Z975 Presence of (intrauterine) contraceptive device: Secondary | ICD-10-CM | POA: Diagnosis not present

## 2020-12-13 DIAGNOSIS — B9689 Other specified bacterial agents as the cause of diseases classified elsewhere: Secondary | ICD-10-CM | POA: Insufficient documentation

## 2020-12-13 MED ORDER — SOLOSEC 2 G PO PACK: 1.0000 | PACK | Freq: Once | ORAL | 0 refills | Status: AC

## 2020-12-13 NOTE — Addendum Note (Signed)
Addended by: Edwyna Shell on: 12/13/2020 10:55 AM   Modules accepted: Orders

## 2020-12-13 NOTE — Patient Instructions (Signed)

## 2020-12-13 NOTE — Progress Notes (Signed)
    GYNECOLOGY PROGRESS NOTE  Subjective:    Patient ID: Karen Sanchez, female    DOB: [birth year], 35 y.o.   MRN: [id number]  HPI  Patient is a 35 y.o. [id number] female who presents for pelvic pain, vaginal discharge, vaginal itching, and breast pain for one week. Does report that she recently transitioned from Depo Provera to an IUD ~2 months ago.  Vaginal discharge is described as heavy, no odor, yellow-white in color.  Breast pain is bilateral.  She does have a history of breast cysts in the past (mostly right sided). Denies nipple discharge.    The following portions of the patient's history were reviewed and updated as appropriate: allergies, current medications, past family history, past medical history, past social history, past surgical history, and problem list.  Review of Systems Pertinent items noted in HPI and remainder of comprehensive ROS otherwise negative.   Objective:   Blood pressure 131/84, pulse 85, height '5\' 3"'$  (1.6 m), weight 185 lb 3.2 oz (84 kg). Body mass index is 32.81 kg/m. General appearance: alert, cooperative, appears stated age, and no distress Breasts: breasts appear normal, no suspicious masses, no skin or nipple changes or axillary nodes. Tenderness noted bilaterally. Abdomen: soft, non-tender; bowel sounds normal; no masses,  no organomegaly Pelvic: external genitalia normal, rectovaginal septum normal.  Vagina with small amount of yellow and brown discharge.  Cervix normal appearing, no lesions and no motion tenderness.  IUD threads not visible, retrieved with cytobrush, ~ 2 cm in length. Uterus mobile, nontender, normal shape and size.  Adnexae non-palpable, nontender bilaterally.  Microscopic wet-mount exam shows numerous clue cells, no hyphae, no trichomonads, few white blood cells. KOH done.   Extremities: extremities normal, atraumatic, no cyanosis or edema Neurologic: Grossly normal   Assessment:   1. Breast pain   2. Acute vaginitis   3.  IUD (intrauterine device) in place      Plan:   Discussed breast pain is likely caused by transitioning of hormonal contraception.  Discussed use of NSAIDs, ice packs. Can consider short trial of estrogen if still no relief as she has been on progesterone methods for quite some time.  Acute vaginitis - wet prep done, appears to have BV infection. Patient notes she is prone to infections during the summertime as she works outdoors. Discussed methods to prevent recurrent vaginitis.  Prescribed Solosec.  IUD in place, appears within normal location based on IUD threads. If pelvic pain persists after treatment of BV infection, would recommend to check positioning of IUD with ultrasound.    Return to clinic for any scheduled appointments or for any gynecologic concerns as needed.    Rubie Maid, MD [facility]

## 2020-12-14 LAB — CERVICOVAGINAL ANCILLARY ONLY
Bacterial Vaginitis (gardnerella): NEGATIVE
Candida Glabrata: NEGATIVE
Candida Vaginitis: NEGATIVE
Comment: NEGATIVE
Comment: NEGATIVE
Comment: NEGATIVE

## 2021-01-06 ENCOUNTER — Encounter: Payer: Federal, State, Local not specified - PPO | Admitting: Obstetrics and Gynecology

## 2021-02-15 DIAGNOSIS — F4323 Adjustment disorder with mixed anxiety and depressed mood: Secondary | ICD-10-CM | POA: Diagnosis not present

## 2021-02-22 DIAGNOSIS — F4323 Adjustment disorder with mixed anxiety and depressed mood: Secondary | ICD-10-CM | POA: Diagnosis not present

## 2021-03-09 DIAGNOSIS — F4323 Adjustment disorder with mixed anxiety and depressed mood: Secondary | ICD-10-CM | POA: Diagnosis not present

## 2021-03-23 DIAGNOSIS — F4323 Adjustment disorder with mixed anxiety and depressed mood: Secondary | ICD-10-CM | POA: Diagnosis not present

## 2021-05-01 DIAGNOSIS — F4323 Adjustment disorder with mixed anxiety and depressed mood: Secondary | ICD-10-CM | POA: Diagnosis not present

## 2021-05-22 ENCOUNTER — Ambulatory Visit: Payer: Federal, State, Local not specified - PPO | Admitting: Family Medicine

## 2021-05-22 ENCOUNTER — Encounter: Payer: Self-pay | Admitting: Family Medicine

## 2021-05-22 ENCOUNTER — Other Ambulatory Visit: Payer: Self-pay

## 2021-05-22 VITALS — BP 104/76 | HR 81 | Temp 97.3°F | Ht 63.0 in | Wt 179.0 lb

## 2021-05-22 DIAGNOSIS — K2950 Unspecified chronic gastritis without bleeding: Secondary | ICD-10-CM

## 2021-05-22 DIAGNOSIS — K580 Irritable bowel syndrome with diarrhea: Secondary | ICD-10-CM

## 2021-05-22 DIAGNOSIS — F4323 Adjustment disorder with mixed anxiety and depressed mood: Secondary | ICD-10-CM | POA: Diagnosis not present

## 2021-05-22 NOTE — Progress Notes (Signed)
Subjective:    Patient ID: Karen Sanchez, female    DOB: June 07, 1985, 36 y.o.   MRN: [id number]  This visit occurred during the SARS-CoV-2 public health emergency.  Safety protocols were in place, including screening questions prior to the visit, additional usage of staff PPE, and extensive cleaning of exam room while observing appropriate contact time as indicated for disinfecting solutions.   HPI Pt presents for FMLA paperwork  Wt Readings from Last 3 Encounters:  05/22/21 179 lb (81.2 kg)  12/13/20 185 lb 3.2 oz (84 kg)  10/31/20 190 lb (86.2 kg)   31.71 kg/m  H/o past gastritis and hiatal hernia and IBS Episodic n/v and abd pain   Doing about the same  Good days and bad days Has a flare about once per week   Tends to happen in the am  Wakes up with nausea and then vomits on way to work  Has vomiting at vomiting quite frequently  Is somewhat used to it  Some times she can work through it and other days not When severe-has to lie down and hold still (phenergen helps but makes her sleepy)   Ginger ale helps a bit  Can try crackers  She uses cholestyramine prn (has to be cautious because it can cause constipation)- helps with diarrhea but not the n/v     Still takes omeprazole  Not sure if it helps  Probiotics make her worse   Gave up dairy  Gave up fried foods  Bad days - still vomiting and abd pain    Was worked up with:  EGD and colonoscopy  (bx were normal) Lab  Stool studies Hydrogen breath test and alpha gal testing and testing for celiac (has not done breath test)   Consider gastric emptying study   No idea as to what is causing this   Lab Results  Component Value Date   ALT 26 09/17/2018   AST 15 09/17/2018   ALKPHOS 81 09/17/2018   BILITOT 0.6 09/17/2018   Lab Results  Component Value Date   CREATININE 0.70 09/17/2018   BUN 7 09/17/2018   NA 138 09/17/2018   K 3.9 09/17/2018   CL 108 09/17/2018   CO2 24 09/17/2018   Patient  Active Problem List   Diagnosis Date Noted   Persistent cough for 3 weeks or longer 08/26/2020   Close exposure to COVID-19 virus 08/26/2020   Heavy menses 07/06/2020   Dog bite 04/27/2020   Elbow injury, initial encounter 11/03/2019   Vaginitis 11/03/2019   Right arm cellulitis 09/17/2019   Laceration of thumb, right 09/02/2019   Headache 09/16/2018   IBS (irritable bowel syndrome) 07/23/2016   Gastritis 05/25/2016   Right hand pain 11/16/2015   Depot contraception 07/12/2014   Headache(784.0) 08/28/2013   Routine general medical examination at a health care facility 03/11/2013   Encounter for routine gynecological examination 03/11/2013   ADJUSTMENT DISORDER WITH ANXIETY 02/11/2009   HYPOGLYCEMIA 07/12/2006   Past Medical History:  Diagnosis Date   Anemia    Anxiety    Depression    Encounter for initial prescription of injectable contraceptive 03/11/2017   Fundic gland polyps of stomach, benign    Hypoglycemia    Past Surgical History:  Procedure Laterality Date   INDUCED ABORTION     age 67   Social History   Tobacco Use   Smoking status: Never   Smokeless tobacco: Never  Vaping Use   Vaping Use: Never used  Substance Use  Topics   Alcohol use: No    Alcohol/week: 0.0 standard drinks   Drug use: No   Family History  Problem Relation Age of Onset   Diabetes Mother    Cancer Maternal Aunt        breast   Cancer Paternal Aunt        breast   Diabetes Father    Diabetes Maternal Grandmother        paternal grandmother   Breast cancer Maternal Aunt    Allergies  Allergen Reactions   Citric Acid Rash    Vaginal irritation   Current Outpatient Medications on File Prior to Visit  Medication Sig Dispense Refill   clonazePAM (KLONOPIN) 1 MG tablet Take 1 mg by mouth at bedtime as needed.     desvenlafaxine (PRISTIQ) 100 MG 24 hr tablet Take 100 mg by mouth daily.     levonorgestrel (MIRENA) 20 MCG/DAY IUD 1 each by Intrauterine route once.     omeprazole  (PRILOSEC) 20 MG capsule TAKE 1 CAPSULE BY MOUTH EVERY DAY 90 capsule 0   promethazine (PHENERGAN) 25 MG tablet Take 1 tablet (25 mg total) by mouth every 8 (eight) hours as needed for nausea or vomiting. 30 tablet 3   terconazole (TERAZOL 7) 0.4 % vaginal cream Apply to affected areas once daily 45 g 0   traZODone (DESYREL) 50 MG tablet Take 1 tablet by mouth at bedtime as needed.     cholestyramine (QUESTRAN) 4 g packet Take 4 g by mouth in the morning and at bedtime.     No current facility-administered medications on file prior to visit.     Review of Systems  Constitutional:  Negative for activity change, appetite change, fatigue, fever and unexpected weight change.  HENT:  Negative for congestion, ear pain, rhinorrhea, sinus pressure and sore throat.   Eyes:  Negative for pain, redness and visual disturbance.  Respiratory:  Negative for cough, shortness of breath and wheezing.   Cardiovascular:  Negative for chest pain and palpitations.  Gastrointestinal:  Positive for abdominal pain, diarrhea, nausea and vomiting. Negative for abdominal distention, blood in stool and constipation.  Endocrine: Negative for polydipsia and polyuria.  Genitourinary:  Negative for dysuria, frequency and urgency.  Musculoskeletal:  Negative for arthralgias, back pain and myalgias.  Skin:  Negative for pallor and rash.  Allergic/Immunologic: Negative for environmental allergies.  Neurological:  Negative for dizziness, syncope and headaches.  Hematological:  Negative for adenopathy. Does not bruise/bleed easily.  Psychiatric/Behavioral:  Positive for dysphoric mood. Negative for decreased concentration. The patient is not nervous/anxious.       Objective:   Physical Exam Constitutional:      General: She is not in acute distress.    Appearance: She is well-developed.  HENT:     Head: Normocephalic and atraumatic.  Eyes:     General: No scleral icterus.    Conjunctiva/sclera: Conjunctivae normal.      Pupils: Pupils are equal, round, and reactive to light.  Cardiovascular:     Rate and Rhythm: Normal rate and regular rhythm.     Heart sounds: Normal heart sounds.  Pulmonary:     Effort: Pulmonary effort is normal. No respiratory distress.     Breath sounds: Normal breath sounds. No wheezing or rales.  Abdominal:     General: Bowel sounds are normal. There is no distension.     Palpations: Abdomen is soft. There is no mass.     Tenderness: There is abdominal tenderness in  the right upper quadrant and epigastric area. There is no guarding or rebound. Negative signs include Murphy's sign and McBurney's sign.  Musculoskeletal:     Cervical back: Normal range of motion and neck supple.  Lymphadenopathy:     Cervical: No cervical adenopathy.  Skin:    General: Skin is warm and dry.     Coloration: Skin is not jaundiced or pale.     Findings: No erythema or rash.  Neurological:     Mental Status: She is alert.     Deep Tendon Reflexes: Reflexes normal.  Psychiatric:        Mood and Affect: Affect is blunt.        Cognition and Memory: Cognition normal.          Assessment & Plan:   Problem List Items Addressed This Visit       Digestive   Gastritis - Primary    Episodic n/v with upper abd pain continue despite omeprazole 20 mg daily  Has seen GI  Notes/studies and labs reviewed in care everywhere  Had EGD/colonosc with nl bx  Nl w/u for celiac and SB over growth and alpha gal No source found She avoids nsaids  Some IBS also   Episodes are as often as weekly and pt misses work avg 0-1 per month  FMLA papers reviewed and filled out today  Enc to continue f/u with GI      IBS (irritable bowel syndrome)    Takes cholestyramine prn (not all the time as it can constipate )

## 2021-05-22 NOTE — Patient Instructions (Addendum)
Keep watching diet   Take care of yourself   Forms done and no changes

## 2021-05-22 NOTE — Assessment & Plan Note (Signed)
Episodic n/v with upper abd pain continue despite omeprazole 20 mg daily  Has seen GI  Notes/studies and labs reviewed in care everywhere  Had EGD/colonosc with nl bx  Nl w/u for celiac and SB over growth and alpha gal No source found She avoids nsaids  Some IBS also   Episodes are as often as weekly and pt misses work avg 0-1 per month  FMLA papers reviewed and filled out today  Enc to continue f/u with GI

## 2021-05-22 NOTE — Assessment & Plan Note (Signed)
Takes cholestyramine prn (not all the time as it can constipate )

## 2021-07-24 ENCOUNTER — Ambulatory Visit (INDEPENDENT_AMBULATORY_CARE_PROVIDER_SITE_OTHER)
Admission: RE | Admit: 2021-07-24 | Discharge: 2021-07-24 | Disposition: A | Discharge status: Home / Self Care | Payer: Federal, State, Local not specified - PPO | Source: Ambulatory Visit | Attending: Family Medicine | Admitting: Family Medicine

## 2021-07-24 ENCOUNTER — Ambulatory Visit: Payer: Federal, State, Local not specified - PPO | Admitting: Family Medicine

## 2021-07-24 ENCOUNTER — Encounter: Payer: Self-pay | Admitting: Family Medicine

## 2021-07-24 DIAGNOSIS — R053 Chronic cough: Secondary | ICD-10-CM | POA: Diagnosis not present

## 2021-07-24 DIAGNOSIS — R059 Cough, unspecified: Secondary | ICD-10-CM | POA: Diagnosis not present

## 2021-07-24 MED ORDER — BENZONATATE 200 MG PO CAPS: 200.0000 mg | ORAL_CAPSULE | Freq: Three times a day (TID) | ORAL | 1 refills | Status: DC | PRN

## 2021-07-24 MED ORDER — PREDNISONE 10 MG PO TABS: ORAL_TABLET | ORAL | 0 refills | Status: DC

## 2021-07-24 NOTE — Patient Instructions (Signed)
Elevate head of your bed if you can  ? ?Take prednisone as directed  ?It may make you hyper and hungry  ?Use nasal saline spray if it helps  ? ?Chest xray today  ? ?Let us know if you get heartburn -this can add to cough also  ? ?I sent in tessalon to help cough  ?

## 2021-07-24 NOTE — Progress Notes (Signed)
? ?Subjective:  ? ? Patient ID: Karen Sanchez, female    DOB: 03/21/1986, 36 y.o.   MRN: [id number] ? ?HPI ?Pt presents for c/o cough for 1 month ? ?Wt Readings from Last 3 Encounters:  ?07/24/21 182 lb (82.6 kg)  ?05/22/21 179 lb (81.2 kg)  ?12/13/20 185 lb 3.2 oz (84 kg)  ? ?32.24 kg/m? ? ?A cough for a month  ?Worse when she lay down /some in early am  ?Dry cough  ?No rattling  ?Thought allergies ?     (Most allergy medicines make her nose bleed)  ? ?No wheezing  ?Gets a little sob with activity due to cough  ?Coughs when she breathes hard and sometimes with laughing  ? ? ? ?Tried some cough and flu medicine  ?Tried mucinex DM  ?Tried claritin D and claritin  ?Tried zyrtec  ? ?Has not used saline recently  ? ?Has not been outside [place] some time off  ? ?No other uri symptoms  ?Just runny nose or sneezing  ?No colored d/c ?Not a lot of drainage  ? ? ?She has seen GI for gastritis  ?Takes omeprazole 20 mg daily  ? ?Patient Active Problem List  ? Diagnosis Date Noted  ? Chronic cough 07/24/2021  ? Persistent cough for 3 weeks or longer 08/26/2020  ? Close exposure to COVID-19 virus 08/26/2020  ? Heavy menses 07/06/2020  ? Dog bite 04/27/2020  ? Elbow injury, initial encounter 11/03/2019  ? Vaginitis 11/03/2019  ? Right arm cellulitis 09/17/2019  ? Laceration of thumb, right 09/02/2019  ? Headache 09/16/2018  ? IBS (irritable bowel syndrome) 07/23/2016  ? Gastritis 05/25/2016  ? Right hand pain 11/16/2015  ? Depot contraception 07/12/2014  ? Headache(784.0) 08/28/2013  ? Routine general medical examination at a health care facility 03/11/2013  ? Encounter for routine gynecological examination 03/11/2013  ? ADJUSTMENT DISORDER WITH ANXIETY 02/11/2009  ? HYPOGLYCEMIA 07/12/2006  ? ?Past Medical History:  ?Diagnosis Date  ? Anemia   ? Anxiety   ? Depression   ? Encounter for initial prescription of injectable contraceptive 03/11/2017  ? Fundic gland polyps of stomach, benign   ? Hypoglycemia   ? ?Past Surgical  History:  ?Procedure Laterality Date  ? INDUCED ABORTION    ? age 69  ? ?Social History  ? ?Tobacco Use  ? Smoking status: Never  ? Smokeless tobacco: Never  ?Vaping Use  ? Vaping Use: Never used  ?Substance Use Topics  ? Alcohol use: No  ?  Alcohol/week: 0.0 standard drinks  ? Drug use: No  ? ?Family History  ?Problem Relation Age of Onset  ? Diabetes Mother   ? Cancer Maternal Aunt   ?     breast  ? Cancer Paternal Aunt   ?     breast  ? Diabetes Father   ? Diabetes Maternal Grandmother   ?     paternal grandmother  ? Breast cancer Maternal Aunt   ? ?Allergies  ?Allergen Reactions  ? Citric Acid Rash  ?  Vaginal irritation  ? ?Current Outpatient Medications on File Prior to Visit  ?Medication Sig Dispense Refill  ? clonazePAM (KLONOPIN) 1 MG tablet Take 1 mg by mouth at bedtime as needed.    ? desvenlafaxine (PRISTIQ) 100 MG 24 hr tablet Take 100 mg by mouth daily.    ? levonorgestrel (MIRENA) 20 MCG/DAY IUD 1 each by Intrauterine route once.    ? omeprazole (PRILOSEC) 20 MG capsule TAKE 1 CAPSULE BY MOUTH  EVERY DAY 90 capsule 0  ? promethazine (PHENERGAN) 25 MG tablet Take 1 tablet (25 mg total) by mouth every 8 (eight) hours as needed for nausea or vomiting. 30 tablet 3  ? terconazole (TERAZOL 7) 0.4 % vaginal cream Apply to affected areas once daily 45 g 0  ? traZODone (DESYREL) 50 MG tablet Take 1 tablet by mouth at bedtime as needed.    ? cholestyramine (QUESTRAN) 4 g packet Take 4 g by mouth in the morning and at bedtime.    ? ?No current facility-administered medications on file prior to visit.  ?  ?Review of Systems  ?Constitutional:  Negative for activity change, appetite change, fatigue, fever and unexpected weight change.  ?HENT:  Negative for congestion, ear pain, postnasal drip, rhinorrhea, sinus pressure, sore throat and voice change.   ?Eyes:  Negative for pain, redness and visual disturbance.  ?Respiratory:  Positive for cough. Negative for apnea, choking, chest tightness, shortness of breath,  wheezing and stridor.   ?Cardiovascular:  Negative for chest pain and palpitations.  ?Gastrointestinal:  Negative for abdominal pain, blood in stool, constipation and diarrhea.  ?     Occ heartburn  ?Endocrine: Negative for polydipsia and polyuria.  ?Genitourinary:  Negative for dysuria, frequency and urgency.  ?Musculoskeletal:  Negative for arthralgias, back pain and myalgias.  ?Skin:  Negative for pallor and rash.  ?Allergic/Immunologic: Negative for environmental allergies.  ?Neurological:  Negative for dizziness, syncope and headaches.  ?Hematological:  Negative for adenopathy. Does not bruise/bleed easily.  ?Psychiatric/Behavioral:  Negative for decreased concentration and dysphoric mood. The patient is not nervous/anxious.   ? ?   ?Objective:  ? Physical Exam ?Constitutional:   ?   General: She is not in acute distress. ?   Appearance: Normal appearance. She is well-developed. She is obese. She is not ill-appearing or diaphoretic.  ?HENT:  ?   Head: Normocephalic and atraumatic.  ?   Right Ear: Tympanic membrane, ear canal and external ear normal.  ?   Left Ear: Tympanic membrane, ear canal and external ear normal.  ?   Nose: Nose normal. No congestion or rhinorrhea.  ?   Mouth/Throat:  ?   Mouth: Mucous membranes are moist.  ?   Pharynx: No posterior oropharyngeal erythema.  ?Eyes:  ?   General:     ?   Right eye: No discharge.     ?   Left eye: No discharge.  ?   Conjunctiva/sclera: Conjunctivae normal.  ?   Pupils: Pupils are equal, round, and reactive to light.  ?Neck:  ?   Thyroid: No thyromegaly.  ?   Vascular: No carotid bruit or JVD.  ?Cardiovascular:  ?   Rate and Rhythm: Normal rate and regular rhythm.  ?   Heart sounds: Normal heart sounds.  ?  No gallop.  ?Pulmonary:  ?   Effort: Pulmonary effort is normal. No respiratory distress.  ?   Breath sounds: Normal breath sounds. No stridor. No wheezing, rhonchi or rales.  ?Chest:  ?   Chest wall: No tenderness.  ?Abdominal:  ?   General: There is no  distension or abdominal bruit.  ?   Palpations: Abdomen is soft.  ?Musculoskeletal:  ?   Cervical back: Normal range of motion and neck supple.  ?   Right lower leg: No edema.  ?   Left lower leg: No edema.  ?Lymphadenopathy:  ?   Cervical: No cervical adenopathy.  ?Skin: ?   General: Skin is warm and  dry.  ?   Coloration: Skin is not pale.  ?   Findings: No rash.  ?Neurological:  ?   Mental Status: She is alert.  ?   Coordination: Coordination normal.  ?   Deep Tendon Reflexes: Reflexes are normal and symmetric. Reflexes normal.  ?Psychiatric:     ?   Mood and Affect: Mood normal.  ? ? ? ? ? ?   ?Assessment & Plan:  ? ?Problem List Items Addressed This Visit   ? ?  ? Other  ? Chronic cough  ?  One month, dry cough without assoc symptoms  ?Allergies and acid reflux are in the differential  ?She does not tolerate allergy meds (nose bleeds) ?Reassuring exam ?Px prednisone 40 mg taper (disc pot side eff)  ?Tessalon for cough ?cxr ordered- reading to follow  ?Continue omeprazole  ? ?Update if not starting to improve in a week or if worsening   ? ?  ?  ? Relevant Orders  ? DG Chest 2 View  ? ? ? ?

## 2021-07-24 NOTE — Assessment & Plan Note (Addendum)
One month, dry cough without assoc symptoms  ?Allergies and acid reflux are in the differential  ?She does not tolerate allergy meds (nose bleeds) ?Reassuring exam ?Px prednisone 40 mg taper (disc pot side eff)  ?Tessalon for cough ?cxr ordered- reading to follow  ?Continue omeprazole  ? ?Update if not starting to improve in a week or if worsening   ?

## 2021-08-07 DIAGNOSIS — F4323 Adjustment disorder with mixed anxiety and depressed mood: Secondary | ICD-10-CM | POA: Diagnosis not present

## 2021-08-18 ENCOUNTER — Ambulatory Visit: Payer: Federal, State, Local not specified - PPO | Admitting: Family Medicine

## 2021-08-18 ENCOUNTER — Ambulatory Visit (INDEPENDENT_AMBULATORY_CARE_PROVIDER_SITE_OTHER)
Admission: RE | Admit: 2021-08-18 | Discharge: 2021-08-18 | Disposition: A | Discharge status: Home / Self Care | Payer: Federal, State, Local not specified - PPO | Source: Ambulatory Visit | Attending: Family Medicine | Admitting: Family Medicine

## 2021-08-18 ENCOUNTER — Encounter: Payer: Self-pay | Admitting: Family Medicine

## 2021-08-18 VITALS — BP 118/62 | HR 100 | Temp 97.4°F | Ht 63.0 in | Wt 179.0 lb

## 2021-08-18 DIAGNOSIS — M7918 Myalgia, other site: Secondary | ICD-10-CM

## 2021-08-18 DIAGNOSIS — S20212A Contusion of left front wall of thorax, initial encounter: Secondary | ICD-10-CM | POA: Diagnosis not present

## 2021-08-18 DIAGNOSIS — W19XXXA Unspecified fall, initial encounter: Secondary | ICD-10-CM

## 2021-08-18 DIAGNOSIS — S20219A Contusion of unspecified front wall of thorax, initial encounter: Secondary | ICD-10-CM | POA: Insufficient documentation

## 2021-08-18 DIAGNOSIS — R102 Pelvic and perineal pain: Secondary | ICD-10-CM | POA: Diagnosis not present

## 2021-08-18 MED ORDER — MELOXICAM 15 MG PO TABS: 7.5000 mg | ORAL_TABLET | Freq: Every day | ORAL | 0 refills | Status: DC | PRN

## 2021-08-18 NOTE — Patient Instructions (Addendum)
Use ice when you can  ? ? ?Try the meloxicam 1/2 pill daily as needed with a meal  ?Instead of ibuprofen  ?Tylenol is ok with this   ? ?Xray today (pelvis)  ? ?If symptoms suddenly worsen or change let us know  ? ?Keep walking  ?Lean to the right when sitting/lying  ? ? ? ?

## 2021-08-18 NOTE — Progress Notes (Signed)
? ?Subjective:  ? ? Patient ID: Karen Sanchez, female    DOB: 05/27/1985, 36 y.o.   MRN: [id number] ? ?HPI ?Pt presents for pain in back and L hip after a fall  ? ?Wt Readings from Last 3 Encounters:  ?08/18/21 179 lb (81.2 kg)  ?07/24/21 182 lb (82.6 kg)  ?05/22/21 179 lb (81.2 kg)  ? ?31.71 kg/m? ? ? ?Fell on 5/4-was chasing chickens through woods  ?Was up high on a limb and fell out  ?Fell on L side ?Took a while to get moving - also had a limb cut her chest and then hip/butt /leg area  ? ? ?Pain in L back and hip area  ?Not a lot of visible bruising  ? ?Hurts a lot  ?Constant  ?Very sore  ?Improves to lie down and to get up and walk  ?First drove today  ?Pain rad to knee-that is better now  ? ? ?Using ibuprofen and ice  ?Once daily 400 mg  ? ?Pelvic film today: no acute fractures ? ?Patient Active Problem List  ? Diagnosis Date Noted  ? Fall 08/18/2021  ? Pain in buttock 08/18/2021  ? Contusion, chest wall 08/18/2021  ? Chronic cough 07/24/2021  ? Persistent cough for 3 weeks or longer 08/26/2020  ? Heavy menses 07/06/2020  ? Elbow injury, initial encounter 11/03/2019  ? Vaginitis 11/03/2019  ? IBS (irritable bowel syndrome) 07/23/2016  ? Gastritis 05/25/2016  ? Depot contraception 07/12/2014  ? Routine general medical examination at a health care facility 03/11/2013  ? Encounter for routine gynecological examination 03/11/2013  ? ADJUSTMENT DISORDER WITH ANXIETY 02/11/2009  ? HYPOGLYCEMIA 07/12/2006  ? ?Past Medical History:  ?Diagnosis Date  ? Anemia   ? Anxiety   ? Depression   ? Encounter for initial prescription of injectable contraceptive 03/11/2017  ? Fundic gland polyps of stomach, benign   ? Hypoglycemia   ? ?Past Surgical History:  ?Procedure Laterality Date  ? INDUCED ABORTION    ? age 30  ? ?Social History  ? ?Tobacco Use  ? Smoking status: Never  ? Smokeless tobacco: Never  ?Vaping Use  ? Vaping Use: Never used  ?Substance Use Topics  ? Alcohol use: No  ?  Alcohol/week: 0.0 standard drinks  ?  Drug use: No  ? ?Family History  ?Problem Relation Age of Onset  ? Diabetes Mother   ? Cancer Maternal Aunt   ?     breast  ? Cancer Paternal Aunt   ?     breast  ? Diabetes Father   ? Diabetes Maternal Grandmother   ?     paternal grandmother  ? Breast cancer Maternal Aunt   ? ?Allergies  ?Allergen Reactions  ? Citric Acid Rash  ?  Vaginal irritation  ? ?Current Outpatient Medications on File Prior to Visit  ?Medication Sig Dispense Refill  ? clonazePAM (KLONOPIN) 1 MG tablet Take 1 mg by mouth at bedtime as needed.    ? desvenlafaxine (PRISTIQ) 100 MG 24 hr tablet Take 100 mg by mouth daily.    ? levonorgestrel (MIRENA) 20 MCG/DAY IUD 1 each by Intrauterine route once.    ? omeprazole (PRILOSEC) 20 MG capsule TAKE 1 CAPSULE BY MOUTH EVERY DAY 90 capsule 0  ? promethazine (PHENERGAN) 25 MG tablet Take 1 tablet (25 mg total) by mouth every 8 (eight) hours as needed for nausea or vomiting. 30 tablet 3  ? terconazole (TERAZOL 7) 0.4 % vaginal cream Apply to affected areas  once daily 45 g 0  ? traZODone (DESYREL) 50 MG tablet Take 1 tablet by mouth at bedtime as needed.    ? cholestyramine (QUESTRAN) 4 g packet Take 4 g by mouth in the morning and at bedtime.    ? ?No current facility-administered medications on file prior to visit.  ?  ? ? ?Review of Systems  ?Constitutional:  Negative for activity change, appetite change, fatigue, fever and unexpected weight change.  ?HENT:  Negative for congestion, ear pain, rhinorrhea, sinus pressure and sore throat.   ?Eyes:  Negative for pain, redness and visual disturbance.  ?Respiratory:  Negative for cough, shortness of breath and wheezing.   ?Cardiovascular:  Negative for chest pain and palpitations.  ?Gastrointestinal:  Negative for abdominal pain, blood in stool, constipation and diarrhea.  ?Endocrine: Negative for polydipsia and polyuria.  ?Genitourinary:  Negative for dysuria, frequency and urgency.  ?Musculoskeletal:  Positive for back pain and myalgias. Negative for  arthralgias and joint swelling.  ?Skin:  Negative for pallor and rash.  ?Allergic/Immunologic: Negative for environmental allergies.  ?Neurological:  Negative for dizziness, syncope and headaches.  ?Hematological:  Negative for adenopathy. Does not bruise/bleed easily.  ?Psychiatric/Behavioral:  Negative for decreased concentration and dysphoric mood. The patient is not nervous/anxious.   ? ?   ?Objective:  ? Physical Exam ?Constitutional:   ?   General: She is not in acute distress. ?   Appearance: Normal appearance. She is obese. She is not ill-appearing or diaphoretic.  ?HENT:  ?   Head: Normocephalic and atraumatic.  ?Eyes:  ?   Conjunctiva/sclera: Conjunctivae normal.  ?   Pupils: Pupils are equal, round, and reactive to light.  ?Cardiovascular:  ?   Rate and Rhythm: Normal rate and regular rhythm.  ?   Heart sounds: Normal heart sounds.  ?Pulmonary:  ?   Effort: Pulmonary effort is normal. No respiratory distress.  ?   Breath sounds: Normal breath sounds. No stridor. No wheezing.  ?   Comments: Some ecchymosis (old) on L ant chest wall ?Mild tenderness ?No crepitus or step off on rib exam ?Chest:  ?   Chest wall: Tenderness present.  ?Musculoskeletal:  ?   Cervical back: Normal range of motion. No rigidity or tenderness.  ?   Right lower leg: No edema.  ?   Left lower leg: No edema.  ?   Comments: L lower buttock, mild ecchymosis and tenderness  ? ?Nl rom of LS and L hip and knee/ankle  ?Nl gait  ?Noted soreness over L ischial spine (pelvis)  ?  ?Lymphadenopathy:  ?   Cervical: No cervical adenopathy.  ?Neurological:  ?   Mental Status: She is alert.  ?   Motor: No weakness.  ?   Coordination: Coordination normal.  ?   Deep Tendon Reflexes: Reflexes normal.  ?   Comments: Neg SLR bilat  ?Psychiatric:     ?   Mood and Affect: Mood normal.  ? ? ? ? ? ?   ?Assessment & Plan:  ? ?Problem List Items Addressed This Visit   ? ?  ? Other  ? Contusion, chest wall  ?  From recent fall onto L side ?Contusion/old bruising  noted  ?No dyspnea or pain with breathing  ?Px meloxicam to try 7.5 mg daily prn ?Ice /heat recommended ?Update if not starting to improve in a week or if worsening   ? ?  ?  ? Fall  ?  Recent trip and fall onto L side ?  Some residual pain and sorenss ?Reassuring exam and pelvic xray ?Contusions noted ?meloxicam px 7.5 mg daily prn  ?Update if not starting to improve in a week or if worsening   ? ?  ?  ? Relevant Orders  ? DG Pelvis 1-2 Views (Completed)  ? Pain in buttock - Primary  ?  S/p fall onto L side ?Contusions noted  ?Reassuring exam and pelvic xray  ?Disc use of ice/heat and analgesia  ?Px meloxicam 7.5 mg daily prn  ?Walking/stretching as tolerated ? ?Update if not starting to improve in a week or if worsening   ? ?  ?  ? Relevant Orders  ? DG Pelvis 1-2 Views (Completed)  ? ? ?

## 2021-08-19 ENCOUNTER — Other Ambulatory Visit: Payer: Self-pay | Admitting: Family Medicine

## 2021-08-20 NOTE — Assessment & Plan Note (Signed)
Recent trip and fall onto L side ?Some residual pain and sorenss ?Reassuring exam and pelvic xray ?Contusions noted ?meloxicam px 7.5 mg daily prn  ?Update if not starting to improve in a week or if worsening   ?

## 2021-08-20 NOTE — Assessment & Plan Note (Signed)
S/p fall onto L side ?Contusions noted  ?Reassuring exam and pelvic xray  ?Disc use of ice/heat and analgesia  ?Px meloxicam 7.5 mg daily prn  ?Walking/stretching as tolerated ? ?Update if not starting to improve in a week or if worsening   ?

## 2021-08-20 NOTE — Assessment & Plan Note (Signed)
From recent fall onto L side ?Contusion/old bruising noted  ?No dyspnea or pain with breathing  ?Px meloxicam to try 7.5 mg daily prn ?Ice /heat recommended ?Update if not starting to improve in a week or if worsening   ?

## 2021-08-21 ENCOUNTER — Telehealth: Payer: Self-pay

## 2021-08-21 DIAGNOSIS — F4323 Adjustment disorder with mixed anxiety and depressed mood: Secondary | ICD-10-CM | POA: Diagnosis not present

## 2021-08-21 NOTE — Telephone Encounter (Signed)
-----   Message from Abner Greenspan, MD sent at 08/18/2021  1:00 PM EDT ----- ?Your pelvis xray looks ok/no fractures seen  ?If symptoms do not continue to improve in the next week please let us know  ?

## 2021-08-21 NOTE — Telephone Encounter (Signed)
I have attempted without success to contact this patient by phone to discuss lab results.

## 2021-09-11 DIAGNOSIS — F4323 Adjustment disorder with mixed anxiety and depressed mood: Secondary | ICD-10-CM | POA: Diagnosis not present

## 2021-10-23 ENCOUNTER — Telehealth: Payer: Federal, State, Local not specified - PPO | Admitting: Emergency Medicine

## 2021-10-23 ENCOUNTER — Encounter: Payer: Self-pay | Admitting: Family Medicine

## 2021-10-23 ENCOUNTER — Ambulatory Visit: Payer: Federal, State, Local not specified - PPO | Admitting: Family Medicine

## 2021-10-23 VITALS — BP 110/68 | HR 76 | Ht 63.0 in | Wt 179.0 lb

## 2021-10-23 DIAGNOSIS — T675XXA Heat exhaustion, unspecified, initial encounter: Secondary | ICD-10-CM | POA: Insufficient documentation

## 2021-10-23 DIAGNOSIS — Z0289 Encounter for other administrative examinations: Secondary | ICD-10-CM | POA: Diagnosis not present

## 2021-10-23 DIAGNOSIS — K2950 Unspecified chronic gastritis without bleeding: Secondary | ICD-10-CM

## 2021-10-23 MED ORDER — ONDANSETRON 4 MG PO TBDP: 4.0000 mg | ORAL_TABLET | Freq: Three times a day (TID) | ORAL | 0 refills | Status: DC | PRN

## 2021-10-23 NOTE — Progress Notes (Signed)
Subjective:    Patient ID: Karen Sanchez, female    DOB: September 13, 1985, 36 y.o.   MRN: [id number]  HPI Pt presents to discuss her risk of heat illness  Wt Readings from Last 3 Encounters:  10/23/21 179 lb (81.2 kg)  08/18/21 179 lb (81.2 kg)  07/24/21 182 lb (82.6 kg)   31.71 kg/m  Trying to avoid heat stress  Is out 8 or more hours without AC On street-some walking  No AC in the truck        Coming in early helps   Works at [employer]  If she comes in early - then not on the street  Requests starting work 7:30 instead of 8:30   Her chronic nausea and vomiting is worse in am and heat makes it a lot worse  Then has to avoid heating    BP Readings from Last 3 Encounters:  10/23/21 110/68  08/18/21 118/62  07/24/21 122/70   Pulse Readings from Last 3 Encounters:  10/23/21 76  08/18/21 100  07/24/21 82   Patient Active Problem List   Diagnosis Date Noted   Heat exhaustion 10/23/2021   Fall 08/18/2021   Pain in buttock 08/18/2021   Contusion, chest wall 08/18/2021   Chronic cough 07/24/2021   Persistent cough for 3 weeks or longer 08/26/2020   Heavy menses 07/06/2020   Elbow injury, initial encounter 11/03/2019   Vaginitis 11/03/2019   IBS (irritable bowel syndrome) 07/23/2016   Gastritis 05/25/2016   Depot contraception 07/12/2014   Routine general medical examination at a health care facility 03/11/2013   Encounter for routine gynecological examination 03/11/2013   ADJUSTMENT DISORDER WITH ANXIETY 02/11/2009   HYPOGLYCEMIA 07/12/2006   Past Medical History:  Diagnosis Date   Anemia    Anxiety    Depression    Encounter for initial prescription of injectable contraceptive 03/11/2017   Fundic gland polyps of stomach, benign    Hypoglycemia    Past Surgical History:  Procedure Laterality Date   INDUCED ABORTION     age 63   Social History   Tobacco Use   Smoking status: Never   Smokeless tobacco: Never  Vaping Use   Vaping Use: Never used   Substance Use Topics   Alcohol use: No    Alcohol/week: 0.0 standard drinks of alcohol   Drug use: No   Family History  Problem Relation Age of Onset   Diabetes Mother    Cancer Maternal Aunt        breast   Cancer Paternal Aunt        breast   Diabetes Father    Diabetes Maternal Grandmother        paternal grandmother   Breast cancer Maternal Aunt    Allergies  Allergen Reactions   Citric Acid Rash    Vaginal irritation   Current Outpatient Medications on File Prior to Visit  Medication Sig Dispense Refill   clonazePAM (KLONOPIN) 1 MG tablet Take 1 mg by mouth at bedtime as needed.     desvenlafaxine (PRISTIQ) 100 MG 24 hr tablet Take 100 mg by mouth daily.     levonorgestrel (MIRENA) 20 MCG/DAY IUD 1 each by Intrauterine route once.     omeprazole (PRILOSEC) 20 MG capsule TAKE 1 CAPSULE BY MOUTH EVERY DAY 90 capsule 0   promethazine (PHENERGAN) 25 MG tablet Take 1 tablet (25 mg total) by mouth every 8 (eight) hours as needed for nausea or vomiting. 30 tablet 3  terconazole (TERAZOL 7) 0.4 % vaginal cream Apply to affected areas once daily 45 g 0   traZODone (DESYREL) 50 MG tablet Take 1 tablet by mouth at bedtime as needed.     cholestyramine (QUESTRAN) 4 g packet Take 4 g by mouth in the morning and at bedtime.     No current facility-administered medications on file prior to visit.     Review of Systems  Constitutional:  Positive for diaphoresis and fatigue. Negative for activity change, appetite change, fever and unexpected weight change.  HENT:  Negative for congestion, ear pain, rhinorrhea, sinus pressure and sore throat.   Eyes:  Negative for pain, redness and visual disturbance.  Respiratory:  Negative for cough, shortness of breath and wheezing.   Cardiovascular:  Negative for chest pain and palpitations.  Gastrointestinal:  Negative for abdominal pain, blood in stool, constipation and diarrhea.  Endocrine: Negative for polydipsia and polyuria.   Genitourinary:  Negative for dysuria, frequency and urgency.  Musculoskeletal:  Negative for arthralgias, back pain and myalgias.  Skin:  Negative for pallor and rash.  Allergic/Immunologic: Negative for environmental allergies.  Neurological:  Negative for dizziness, syncope and headaches.  Hematological:  Negative for adenopathy. Does not bruise/bleed easily.  Psychiatric/Behavioral:  Negative for decreased concentration and dysphoric mood. The patient is not nervous/anxious.        Objective:   Physical Exam Constitutional:      General: She is not in acute distress.    Appearance: Normal appearance. She is well-developed. She is obese. She is not ill-appearing or diaphoretic.  HENT:     Head: Normocephalic and atraumatic.  Eyes:     Conjunctiva/sclera: Conjunctivae normal.     Pupils: Pupils are equal, round, and reactive to light.  Neck:     Thyroid: No thyromegaly.     Vascular: No carotid bruit or JVD.  Cardiovascular:     Rate and Rhythm: Normal rate and regular rhythm.     Heart sounds: Normal heart sounds.     No gallop.  Pulmonary:     Effort: Pulmonary effort is normal. No respiratory distress.     Breath sounds: Normal breath sounds. No wheezing or rales.  Abdominal:     General: There is no distension or abdominal bruit.     Palpations: Abdomen is soft. There is no mass.     Tenderness: There is no abdominal tenderness. There is no guarding or rebound.  Musculoskeletal:     Cervical back: Normal range of motion and neck supple.     Right lower leg: No edema.     Left lower leg: No edema.  Lymphadenopathy:     Cervical: No cervical adenopathy.  Skin:    General: Skin is warm and dry.     Coloration: Skin is not pale.     Findings: No rash.  Neurological:     Mental Status: She is alert.     Cranial Nerves: No cranial nerve deficit.     Coordination: Coordination normal.     Deep Tendon Reflexes: Reflexes are normal and symmetric. Reflexes normal.   Psychiatric:        Mood and Affect: Mood normal.           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

## 2021-10-23 NOTE — Patient Instructions (Signed)
Keep hydrated and take breaks when you can  Eat light   Here is a letter for work. If that does not suffice please let us know   If you get dizzy/ rapid heartbeat or dry mouth, get out immediately and  get help

## 2021-10-23 NOTE — Assessment & Plan Note (Signed)
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

## 2021-10-23 NOTE — Assessment & Plan Note (Signed)
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

## 2021-10-23 NOTE — Progress Notes (Signed)
E-Visit for Vomiting  We are sorry that you are not feeling well. Here is how we plan to help!  I can do a work excuse note for today, but if you need something additional, you'll need to discuss with your primary care doctor or with your company's worker's comp provider.  The note has been sent to myChart.  I have prescribed a medication that will help alleviate your symptoms and allow you to stay hydrated:  Zofran 4 mg 1 tablet every 8 hours as needed for nausea and vomiting  HOME CARE: Drink clear liquids.  This is very important! Dehydration (the lack of fluid) can lead to a serious complication.  Start off with 1 tablespoon every 5 minutes for 8 hours. You may begin eating bland foods after 8 hours without vomiting.  Start with saltine crackers, white bread, rice, mashed potatoes, applesauce. After 48 hours on a bland diet, you may resume a normal diet. Try to go to sleep.  Sleep often empties the stomach and relieves the need to vomit.  GET HELP RIGHT AWAY IF:  Your symptoms do not improve or worsen within 2 days after treatment. You have a fever for over 3 days. You cannot keep down fluids after trying the medication.  MAKE SURE YOU:  Understand these instructions. Will watch your condition. Will get help right away if you are not doing well or get worse.   Thank you for choosing an e-visit.  Your e-visit answers were reviewed by a board certified advanced clinical practitioner to complete your personal care plan. Depending upon the condition, your plan could have included both over the counter or prescription medications.  Please review your pharmacy choice. Make sure the pharmacy is open so you can pick up prescription now. If there is a problem, you may contact your provider through [employer] and have the prescription routed to another pharmacy.  Your safety is important to [place]. If you have drug allergies check your prescription carefully.   For the next 24 hours  you can use MyChart to ask questions about today's visit, request a non-urgent call back, or ask for a work or school excuse. You will get an email in the next two days asking about your experience. I hope that your e-visit has been valuable and will speed your recovery.  Approximately 5 minutes was used in reviewing the patient's chart, questionnaire, prescribing medications, and documentation.

## 2021-11-20 ENCOUNTER — Other Ambulatory Visit: Payer: Self-pay | Admitting: Family Medicine

## 2021-11-21 ENCOUNTER — Other Ambulatory Visit (HOSPITAL_COMMUNITY): Payer: Self-pay

## 2021-11-21 MED ORDER — TRAZODONE HCL 50 MG PO TABS
50.0000 mg | ORAL_TABLET | Freq: Every evening | ORAL | 2 refills | Status: DC | PRN
  Filled 2021-11-21: qty 90, 90d supply, fill #0

## 2021-11-21 NOTE — Telephone Encounter (Signed)
Pt was give this back in 11/26/11 by other provider .   Last ov 08/18/21

## 2021-12-01 ENCOUNTER — Other Ambulatory Visit (HOSPITAL_COMMUNITY): Payer: Self-pay

## 2022-02-19 ENCOUNTER — Other Ambulatory Visit: Payer: Self-pay | Admitting: Family Medicine

## 2022-03-28 ENCOUNTER — Other Ambulatory Visit: Payer: Self-pay | Admitting: Family Medicine

## 2022-03-28 DIAGNOSIS — F4323 Adjustment disorder with mixed anxiety and depressed mood: Secondary | ICD-10-CM | POA: Diagnosis not present

## 2022-03-28 NOTE — Telephone Encounter (Signed)
Last OV was an appt to discuss work conditions on 10/23/21, last filled on 06/20/20 #30 tabs/ 3 refills

## 2022-04-12 ENCOUNTER — Telehealth: Payer: Federal, State, Local not specified - PPO | Admitting: Emergency Medicine

## 2022-04-12 DIAGNOSIS — R6889 Other general symptoms and signs: Secondary | ICD-10-CM

## 2022-04-12 MED ORDER — OSELTAMIVIR PHOSPHATE 75 MG PO CAPS: 75.0000 mg | ORAL_CAPSULE | Freq: Two times a day (BID) | ORAL | 0 refills | Status: DC

## 2022-04-12 NOTE — Progress Notes (Signed)

## 2022-04-25 ENCOUNTER — Encounter: Payer: Self-pay | Admitting: Family Medicine

## 2022-04-25 ENCOUNTER — Ambulatory Visit: Payer: Federal, State, Local not specified - PPO | Admitting: Family Medicine

## 2022-04-25 VITALS — BP 124/68 | HR 87 | Temp 97.3°F | Ht 63.0 in | Wt 177.2 lb

## 2022-04-25 DIAGNOSIS — F4323 Adjustment disorder with mixed anxiety and depressed mood: Secondary | ICD-10-CM | POA: Diagnosis not present

## 2022-04-25 DIAGNOSIS — L989 Disorder of the skin and subcutaneous tissue, unspecified: Secondary | ICD-10-CM | POA: Insufficient documentation

## 2022-04-25 NOTE — Assessment & Plan Note (Signed)
Right upper arm 1 cm  Originally started as poss insect bite that got infected (was treated)  Rubbery and mobile and superficial without redness or tenderness or drainage Differential includes seb cyst vs dermatofibroma   Ref to derm for eval and tx

## 2022-04-25 NOTE — Patient Instructions (Signed)
The bump on your arm may be a cyst or perhaps some scar tissue   If it starts to look red or sore let us know   Keep clean with soap and water   I placed a dermatology referral  If you don't get a call in 1-2 weeks call us or [facility]

## 2022-04-25 NOTE — Progress Notes (Signed)
Subjective:    Patient ID: Karen Sanchez, female    DOB: June 09, 1985, 37 y.o.   MRN: [id number]  HPI Pt presents for area of concern on R arm    Wt Readings from Last 3 Encounters:  04/25/22 177 lb 4 oz (80.4 kg)  10/23/21 179 lb (81.2 kg)  08/18/21 179 lb (81.2 kg)   31.40 kg/m  Vitals:   04/25/22 0903  BP: 124/68  Pulse: 87  Temp: (!) 97.3 F (36.3 C)  SpO2: 100%   H/o bump on arm that was infected in past  Infection got better but spot stayed   Now it is inc in size now  Thought it may have started with a bite   Sometimes it itches   Does not pub anything on it   Prone to eczema and rashes   Patient Active Problem List   Diagnosis Date Noted   Skin lesion of right arm 04/25/2022   Heat exhaustion 10/23/2021   Fall 08/18/2021   Pain in buttock 08/18/2021   Contusion, chest wall 08/18/2021   Chronic cough 07/24/2021   Persistent cough for 3 weeks or longer 08/26/2020   Heavy menses 07/06/2020   Elbow injury, initial encounter 11/03/2019   Vaginitis 11/03/2019   IBS (irritable bowel syndrome) 07/23/2016   Gastritis 05/25/2016   Depot contraception 07/12/2014   Routine general medical examination at a health care facility 03/11/2013   Encounter for routine gynecological examination 03/11/2013   ADJUSTMENT DISORDER WITH ANXIETY 02/11/2009   HYPOGLYCEMIA 07/12/2006   Past Medical History:  Diagnosis Date   Anemia    Anxiety    Depression    Encounter for initial prescription of injectable contraceptive 03/11/2017   Fundic gland polyps of stomach, benign    Hypoglycemia    Past Surgical History:  Procedure Laterality Date   INDUCED ABORTION     age 58   Social History   Tobacco Use   Smoking status: Never   Smokeless tobacco: Never  Vaping Use   Vaping Use: Never used  Substance Use Topics   Alcohol use: No    Alcohol/week: 0.0 standard drinks of alcohol   Drug use: No   Family History  Problem Relation Age of Onset   Diabetes  Mother    Cancer Maternal Aunt        breast   Cancer Paternal Aunt        breast   Diabetes Father    Diabetes Maternal Grandmother        paternal grandmother   Breast cancer Maternal Aunt    Allergies  Allergen Reactions   Citric Acid Rash    Vaginal irritation   Current Outpatient Medications on File Prior to Visit  Medication Sig Dispense Refill   clonazePAM (KLONOPIN) 1 MG tablet Take 1 mg by mouth at bedtime as needed.     desvenlafaxine (PRISTIQ) 100 MG 24 hr tablet Take 100 mg by mouth daily.     levonorgestrel (MIRENA) 20 MCG/DAY IUD 1 each by Intrauterine route once.     omeprazole (PRILOSEC) 20 MG capsule TAKE 1 CAPSULE BY MOUTH EVERY DAY 90 capsule 0   promethazine (PHENERGAN) 25 MG tablet TAKE 1 TABLET BY MOUTH EVERY 8 HOURS AS NEEDED FOR NAUSEA OR VOMITING. 30 tablet 3   terconazole (TERAZOL 7) 0.4 % vaginal cream Apply to affected areas once daily 45 g 0   traZODone (DESYREL) 50 MG tablet Take 1 tablet (50 mg total) by mouth at bedtime as  needed. 90 tablet 2   cholestyramine (QUESTRAN) 4 g packet Take 4 g by mouth in the morning and at bedtime.     No current facility-administered medications on file prior to visit.     Review of Systems  Constitutional:  Negative for activity change, appetite change, fatigue, fever and unexpected weight change.  HENT:  Negative for congestion, ear pain, rhinorrhea, sinus pressure and sore throat.   Eyes:  Negative for pain, redness and visual disturbance.  Respiratory:  Negative for cough, shortness of breath and wheezing.   Cardiovascular:  Negative for chest pain and palpitations.  Gastrointestinal:  Negative for abdominal pain, blood in stool, constipation and diarrhea.  Endocrine: Negative for polydipsia and polyuria.  Genitourinary:  Negative for dysuria, frequency and urgency.  Musculoskeletal:  Negative for arthralgias, back pain and myalgias.  Skin:  Negative for pallor and rash.  Allergic/Immunologic: Negative for  environmental allergies.  Neurological:  Negative for dizziness, syncope and headaches.  Hematological:  Negative for adenopathy. Does not bruise/bleed easily.  Psychiatric/Behavioral:  Negative for decreased concentration and dysphoric mood. The patient is not nervous/anxious.        Treated for depression and anxiety        Objective:   Physical Exam Constitutional:      General: She is not in acute distress.    Appearance: Normal appearance. She is obese. She is not ill-appearing.  Eyes:     Conjunctiva/sclera: Conjunctivae normal.     Pupils: Pupils are equal, round, and reactive to light.  Cardiovascular:     Rate and Rhythm: Normal rate and regular rhythm.  Pulmonary:     Effort: Pulmonary effort is normal.     Breath sounds: Normal breath sounds.  Musculoskeletal:        General: No swelling.  Skin:    Comments: 1 cm rubbery and smooth but not fluctuant superficial mobile lump on anterior R upper arm No color change No drainage No tenderness    Fair complexion   Neurological:     Mental Status: She is alert.     Sensory: No sensory deficit.  Psychiatric:        Mood and Affect: Mood normal.           Assessment & Plan:   Problem List Items Addressed This Visit       Musculoskeletal and Integument   Skin lesion of right arm - Primary    Right upper arm 1 cm  Originally started as poss insect bite that got infected (was treated)  Rubbery and mobile and superficial without redness or tenderness or drainage Differential includes seb cyst vs dermatofibroma   Ref to derm for eval and tx       Relevant Orders   Ambulatory referral to Dermatology

## 2022-04-26 ENCOUNTER — Encounter: Payer: Self-pay | Admitting: *Deleted

## 2022-05-14 DIAGNOSIS — F4323 Adjustment disorder with mixed anxiety and depressed mood: Secondary | ICD-10-CM | POA: Diagnosis not present

## 2022-05-17 ENCOUNTER — Other Ambulatory Visit: Payer: Self-pay | Admitting: Family Medicine

## 2022-07-24 IMAGING — DX DG HAND COMPLETE 3+V*R*
3 series · 3 of 3 positions shown · non-contrast
Comparison: Radiograph 11/16/2015

CLINICAL DATA: Dog bite.  Pain and swelling.  Laceration.

EXAM:
RIGHT HAND - COMPLETE 3+ VIEW

[hand obl]
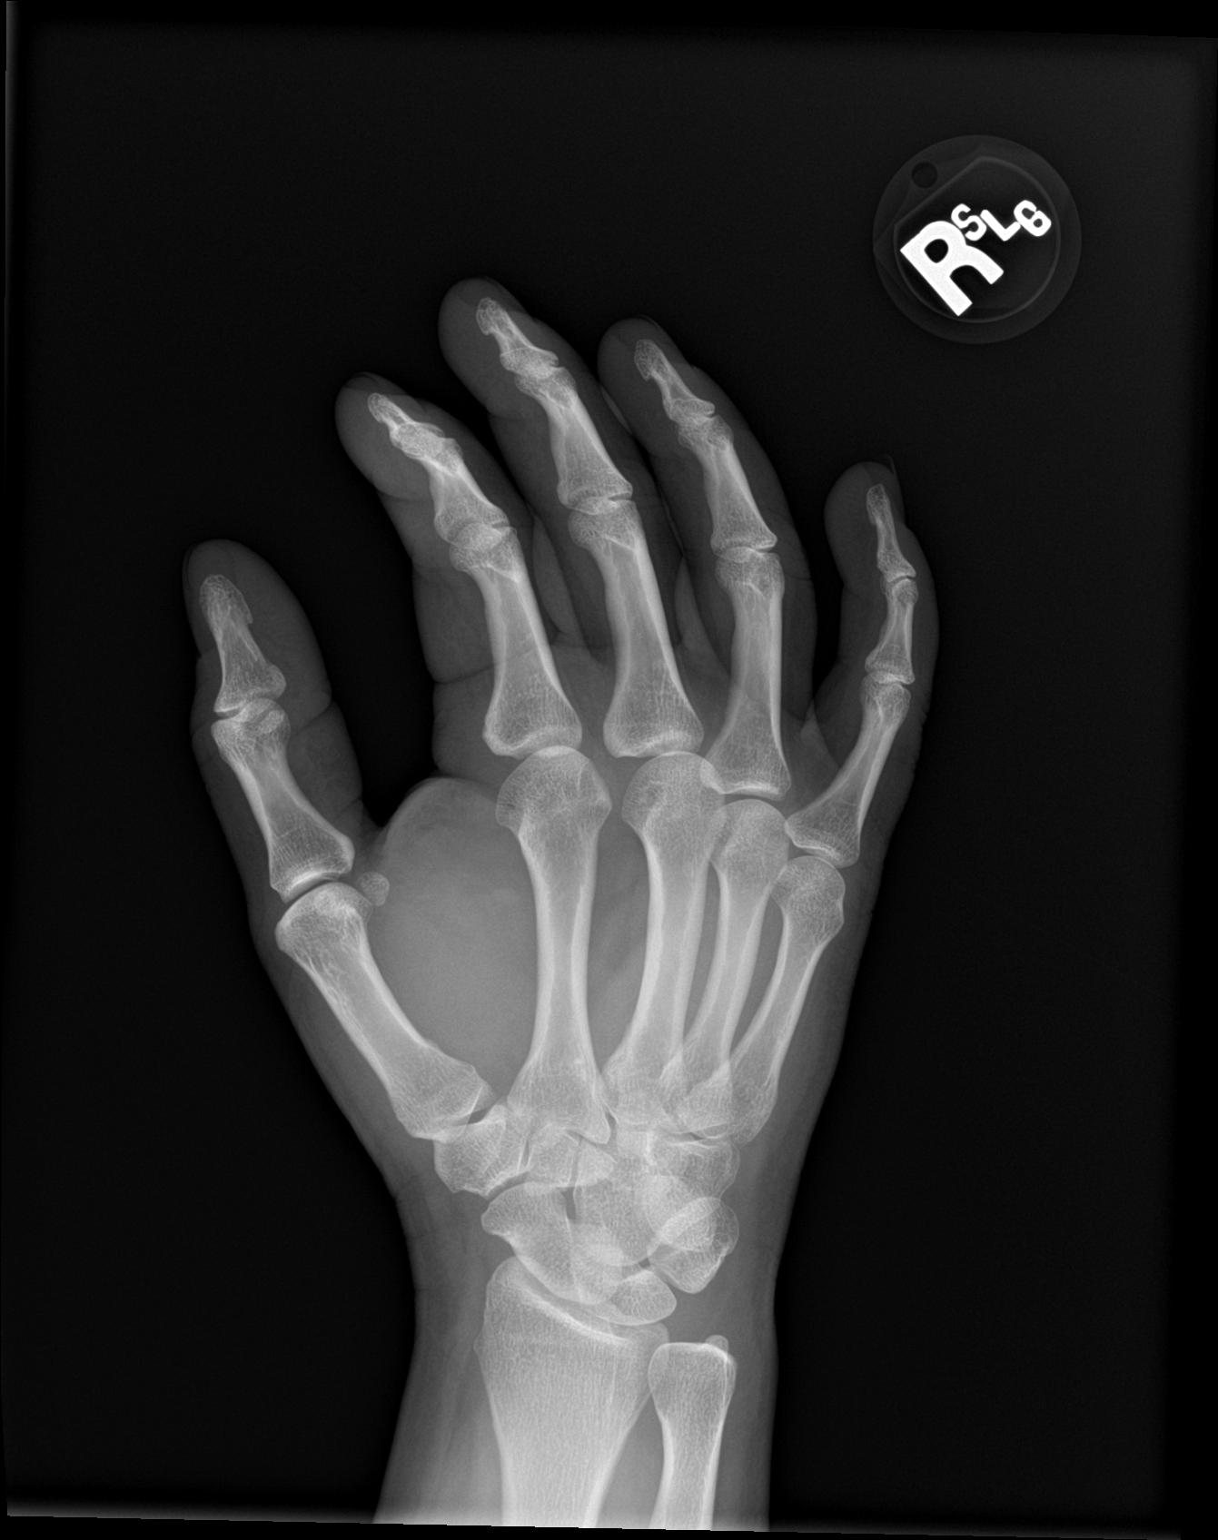

[hand lat]
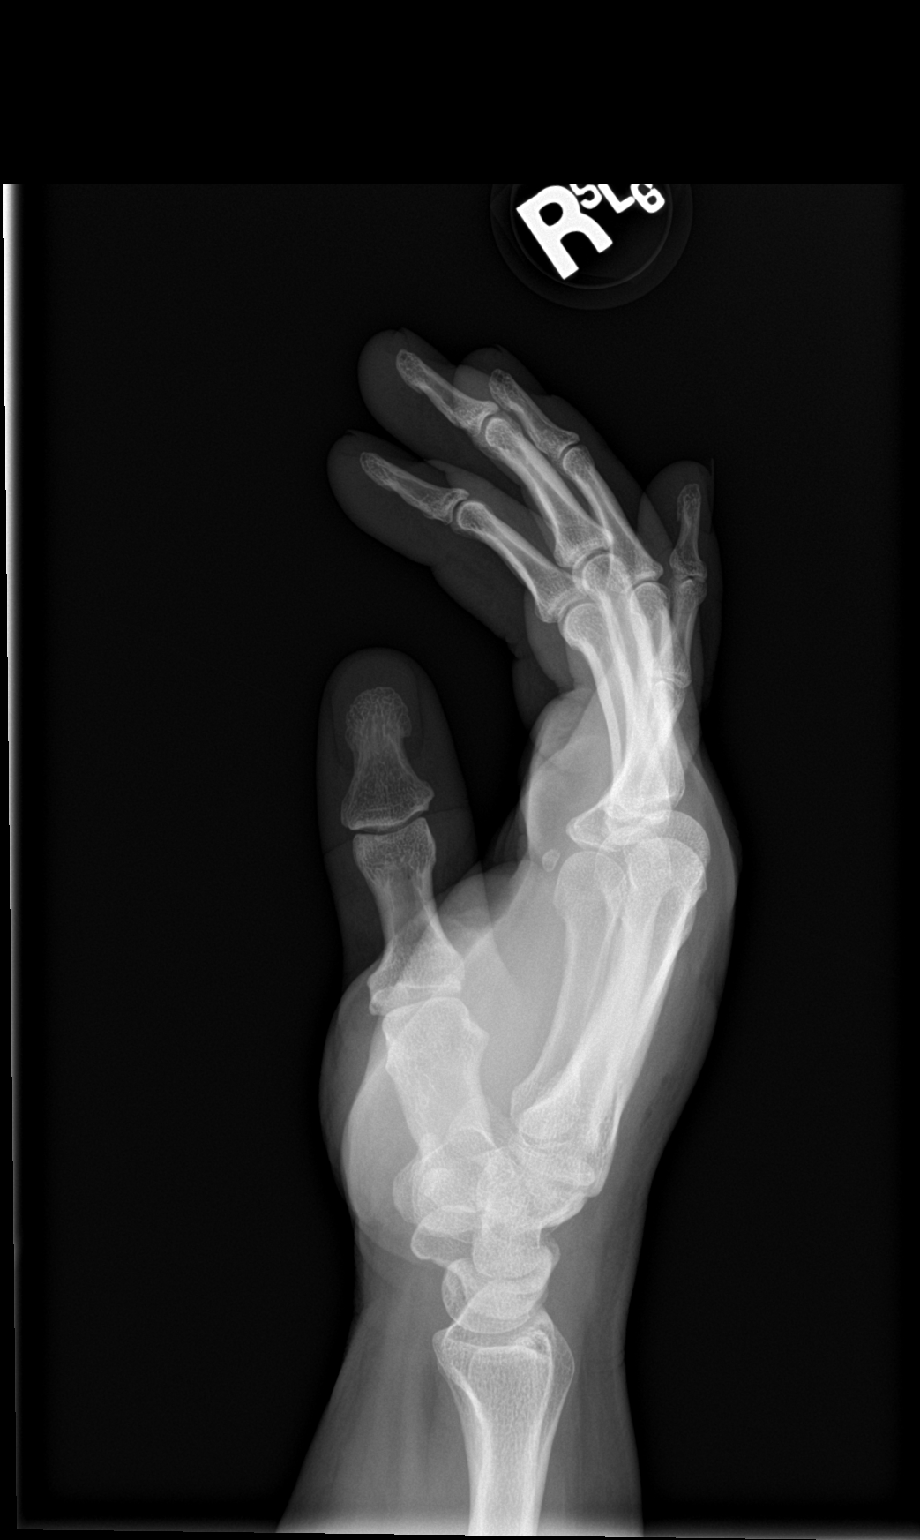

[hand ap]
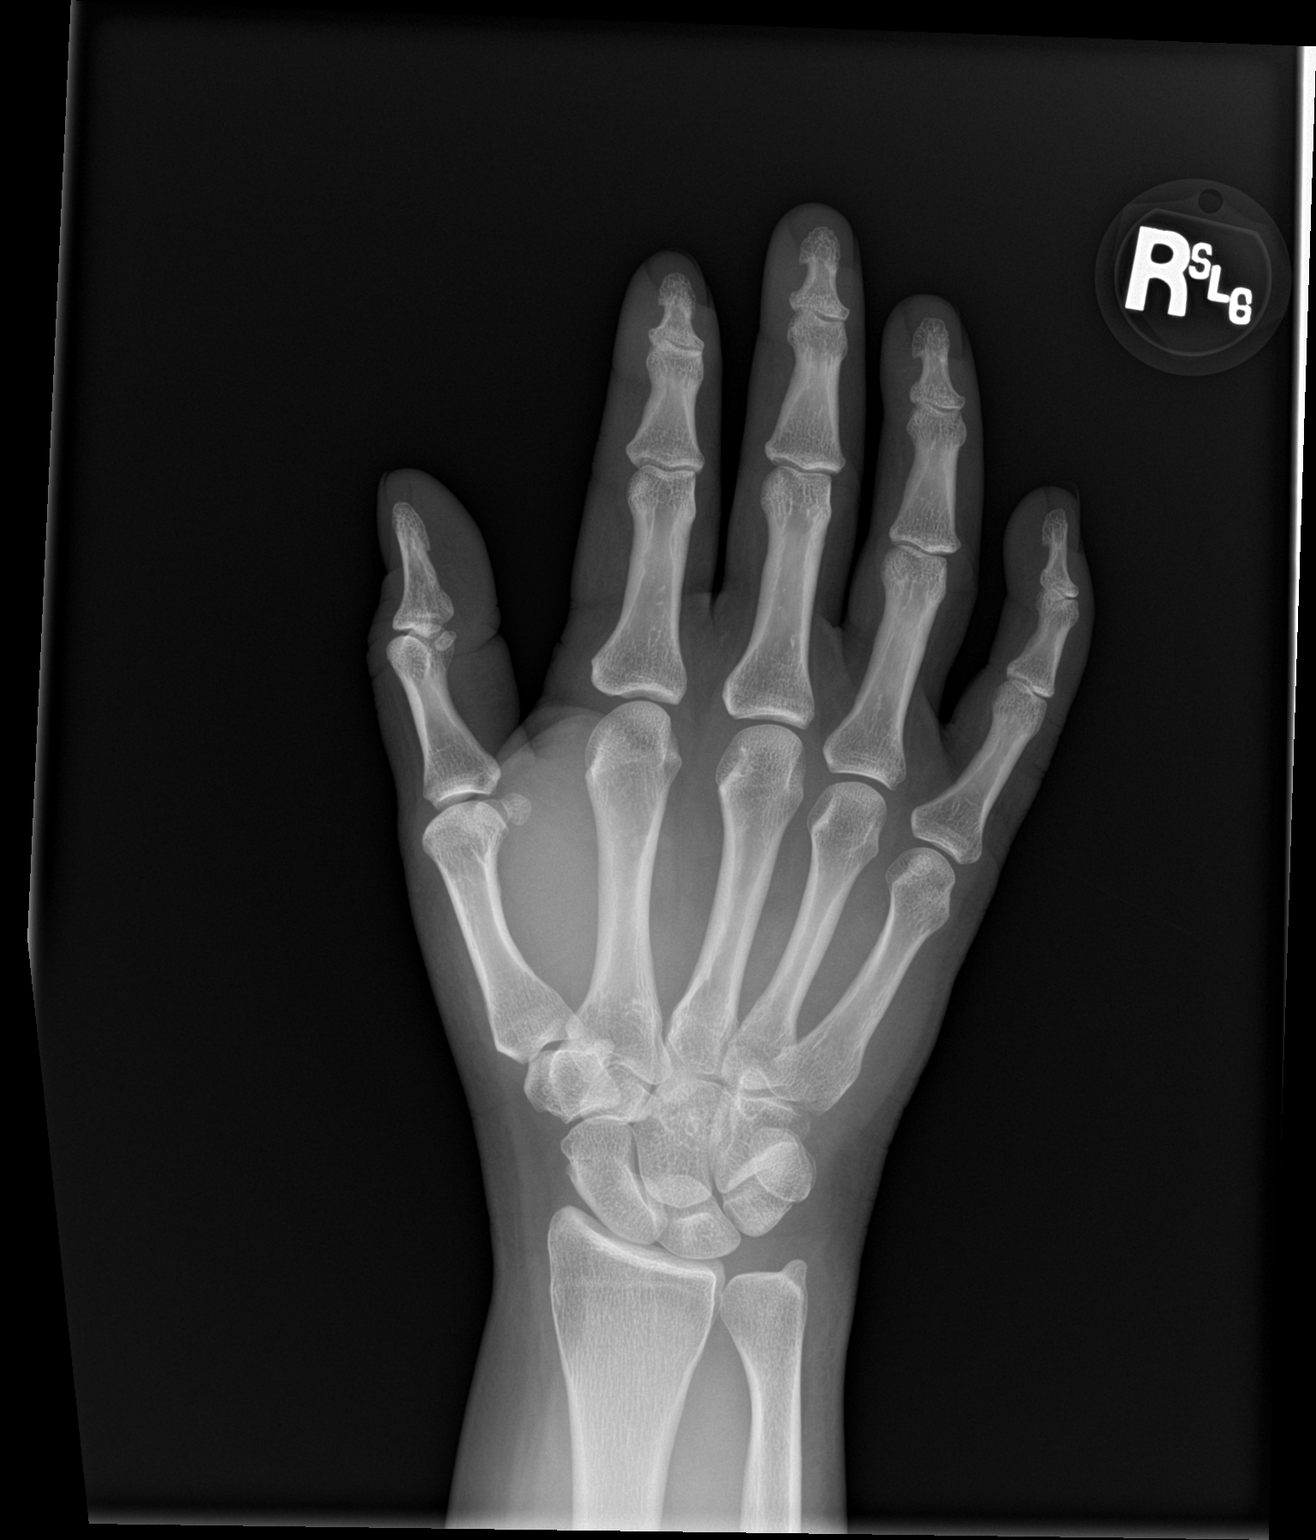

[3 of 3 positions shown; findings below may reference images not displayed]

FINDINGS: There is no evidence of fracture or dislocation. There is no
evidence of arthropathy or other focal bone abnormality. Soft tissue
edema and occasional air overlies the dorsum of the hand. No
radiopaque foreign body.
IMPRESSION: Soft tissue edema and occasional air overlies the dorsum of the
hand. No radiopaque foreign body or osseous abnormality.

## 2022-08-07 IMAGING — DX DG HAND COMPLETE 3+V*R*
3 series · 3 of 3 positions shown · non-contrast
Comparison: Right hand radiographs 04/21/2020

CLINICAL DATA: persistent pain in hand after dog bite

EXAM:
RIGHT HAND - COMPLETE 3+ VIEW

[hand ap]
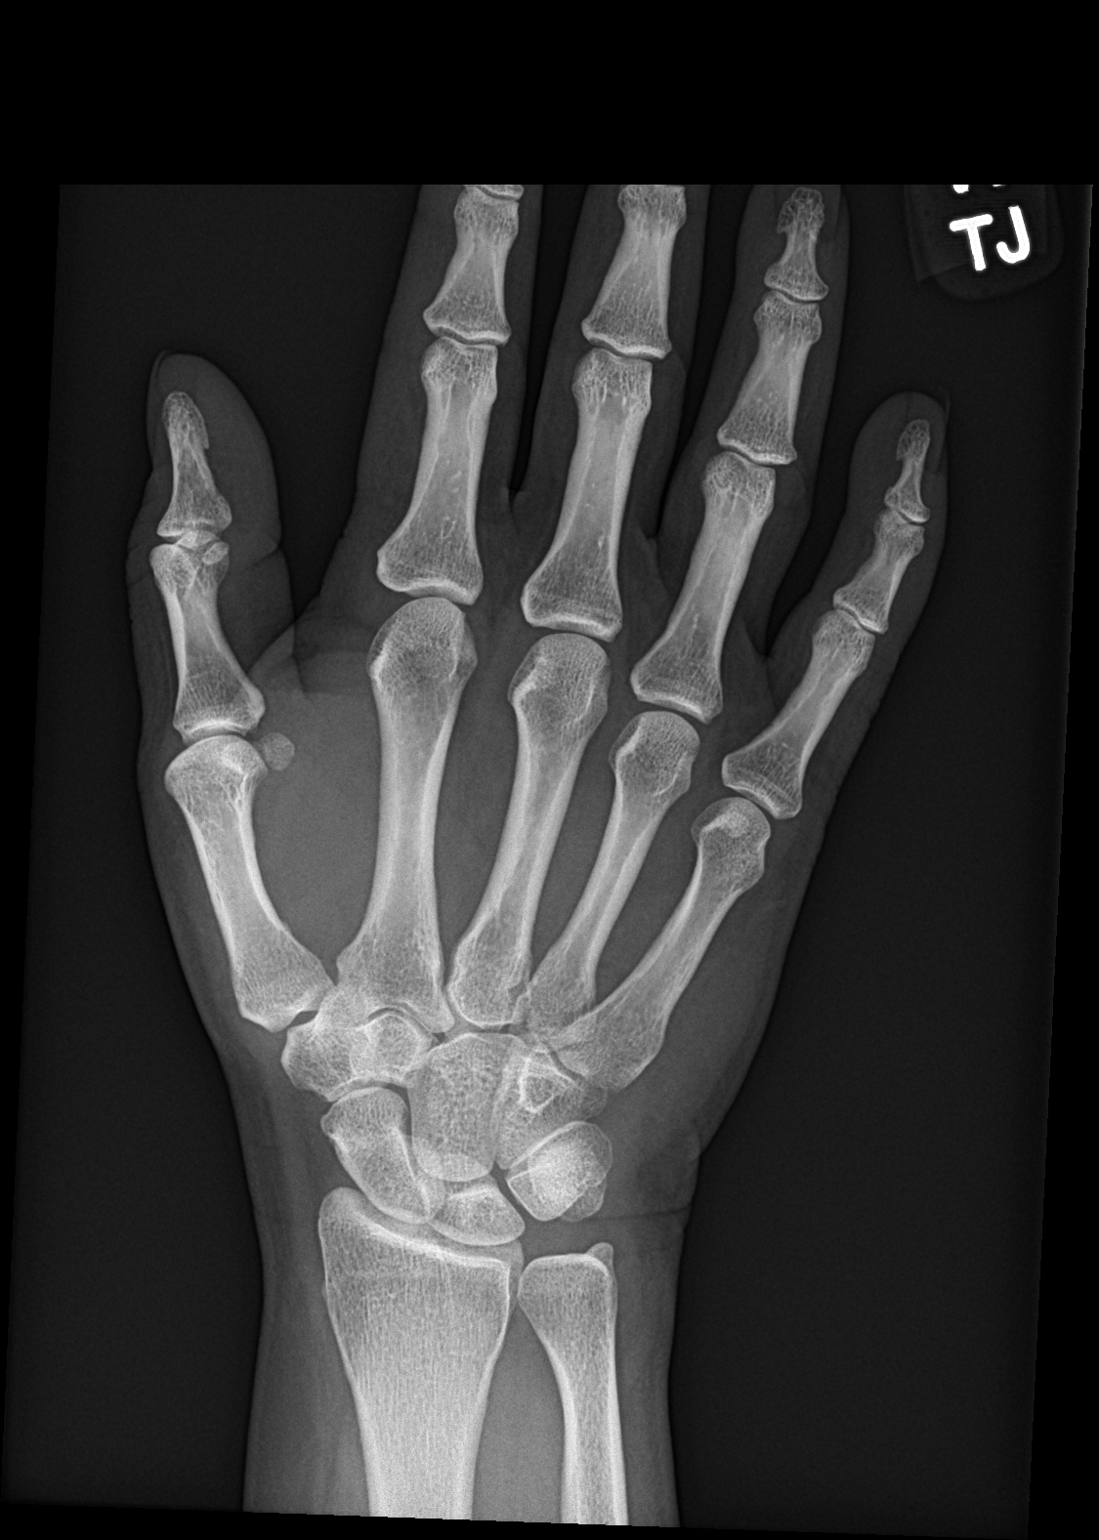

[hand obl]
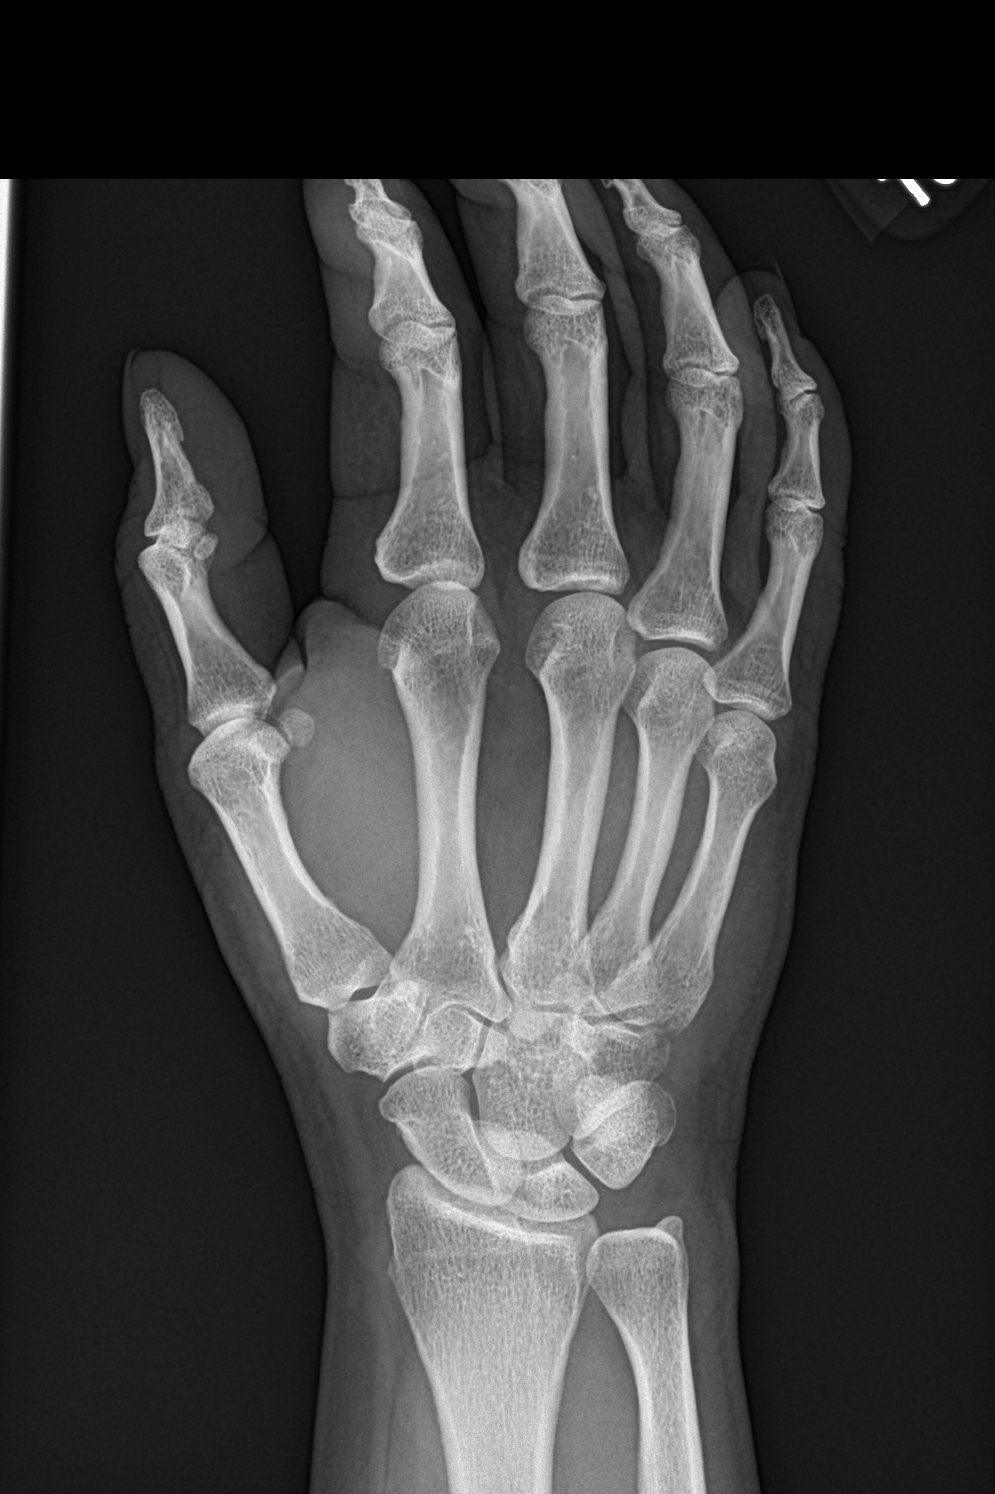

[hand lat]
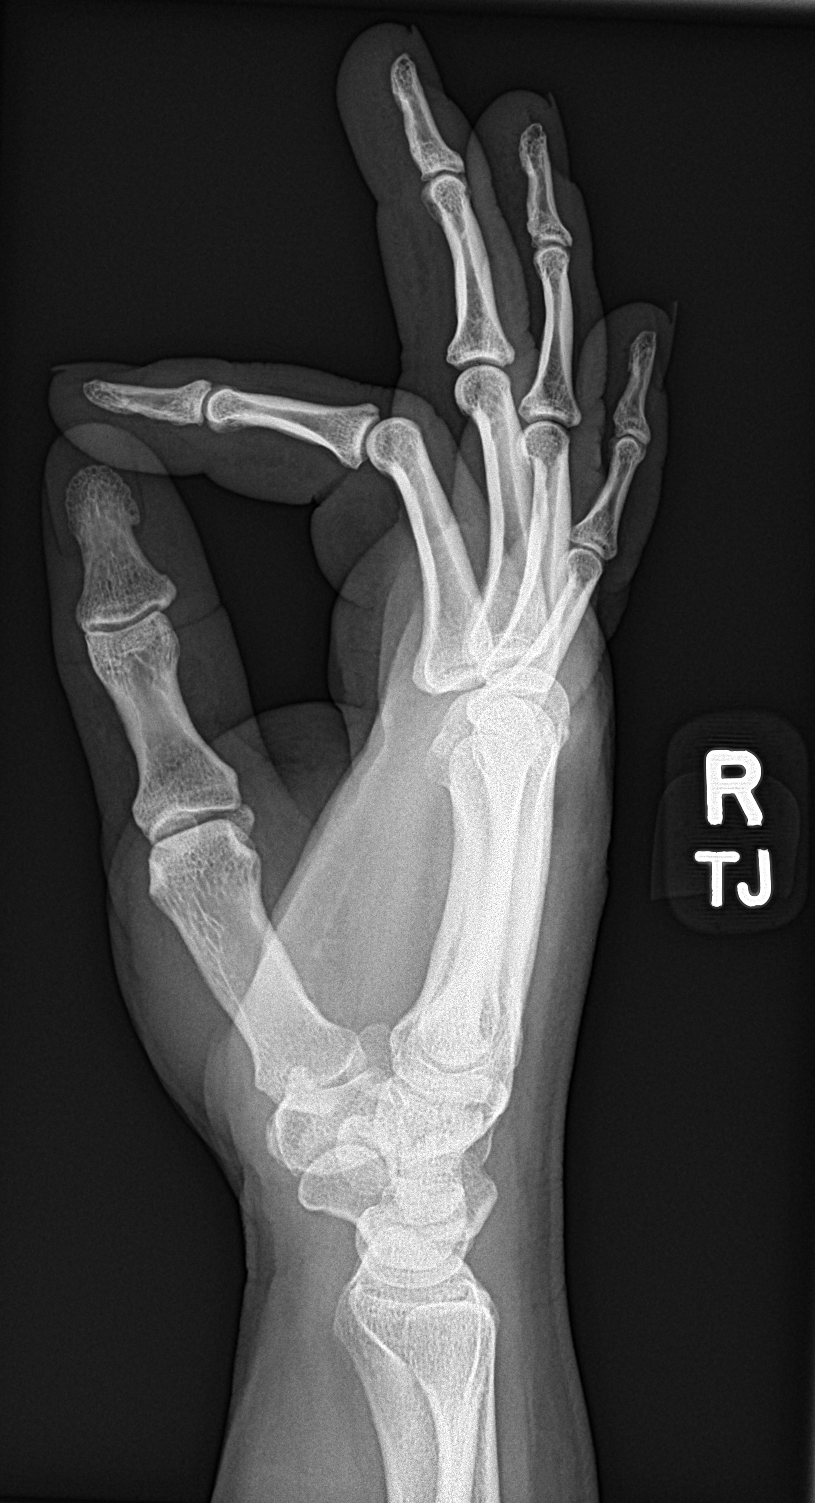

[3 of 3 positions shown; findings below may reference images not displayed]

FINDINGS: There is no evidence of fracture or dislocation. There is no
evidence of arthropathy or other focal bone abnormality. Soft
tissues are unremarkable. No radiopaque foreign body.
IMPRESSION: Negative.

## 2022-11-05 DIAGNOSIS — F331 Major depressive disorder, recurrent, moderate: Secondary | ICD-10-CM | POA: Diagnosis not present

## 2022-11-05 DIAGNOSIS — F4310 Post-traumatic stress disorder, unspecified: Secondary | ICD-10-CM | POA: Diagnosis not present

## 2022-11-05 DIAGNOSIS — L720 Epidermal cyst: Secondary | ICD-10-CM | POA: Diagnosis not present

## 2022-11-19 DIAGNOSIS — L720 Epidermal cyst: Secondary | ICD-10-CM | POA: Diagnosis not present

## 2022-11-19 DIAGNOSIS — L538 Other specified erythematous conditions: Secondary | ICD-10-CM | POA: Diagnosis not present

## 2022-11-19 DIAGNOSIS — L298 Other pruritus: Secondary | ICD-10-CM | POA: Diagnosis not present

## 2022-12-03 DIAGNOSIS — F3341 Major depressive disorder, recurrent, in partial remission: Secondary | ICD-10-CM | POA: Diagnosis not present

## 2022-12-03 DIAGNOSIS — F4310 Post-traumatic stress disorder, unspecified: Secondary | ICD-10-CM | POA: Diagnosis not present

## 2023-01-07 DIAGNOSIS — F4310 Post-traumatic stress disorder, unspecified: Secondary | ICD-10-CM | POA: Diagnosis not present

## 2023-01-07 DIAGNOSIS — F331 Major depressive disorder, recurrent, moderate: Secondary | ICD-10-CM | POA: Diagnosis not present

## 2023-03-11 ENCOUNTER — Other Ambulatory Visit: Payer: Self-pay

## 2023-03-11 ENCOUNTER — Emergency Department (HOSPITAL_COMMUNITY)
Admission: EM | Admit: 2023-03-11 | Discharge: 2023-03-11 | Disposition: A | Discharge status: Home / Self Care | Attending: Emergency Medicine | Admitting: Emergency Medicine

## 2023-03-11 ENCOUNTER — Emergency Department (HOSPITAL_COMMUNITY)

## 2023-03-11 DIAGNOSIS — Y990 Civilian activity done for income or pay: Secondary | ICD-10-CM | POA: Diagnosis not present

## 2023-03-11 DIAGNOSIS — X501XXA Overexertion from prolonged static or awkward postures, initial encounter: Secondary | ICD-10-CM | POA: Diagnosis not present

## 2023-03-11 DIAGNOSIS — M79605 Pain in left leg: Secondary | ICD-10-CM | POA: Insufficient documentation

## 2023-03-11 MED ORDER — ACETAMINOPHEN 500 MG PO TABS
1000.0000 mg | ORAL_TABLET | Freq: Once | ORAL | Status: DC
  Filled 2023-03-11: qty 2

## 2023-03-11 NOTE — Discharge Instructions (Addendum)
You were seen here today for left calf pain.  Your workup showed no broken bones in your lower leg.  You may have had a cyst pop in your leg today is because your pain.  Please use Motrin and Tylenol as needed for pain.  Please follow-up with your PCP orthopedic surgery.  Please return to the emergency department for increasing pain, numbness, tingling, weakness in your foot, or tense calf muscle, chest pain, shortness of breath, severe vomiting or inability to keep down food, loss of consciousness, or any worsening symptom or concern.

## 2023-03-11 NOTE — ED Notes (Signed)
Ortho tech called to place CAM boot.

## 2023-03-11 NOTE — ED Provider Notes (Signed)
I saw and evaluated the patient, reviewed the resident's note and I agree with the findings and plan.       Arby Barrette, MD 03/18/23 [phone]

## 2023-03-11 NOTE — Progress Notes (Signed)
Orthopedic Tech Progress Note Patient Details:  Karen Sanchez Mar 07, 1986 [id number]  Applied CAM walker Ortho Devices Type of Ortho Device: CAM walker Ortho Device/Splint Location: LLE Ortho Device/Splint Interventions: Ordered, Application, Adjustment   Post Interventions Patient Tolerated: Well Instructions Provided: Care of device, Adjustment of device  [name] 03/11/2023, 11:43 PM

## 2023-03-11 NOTE — ED Provider Notes (Signed)
[place] EMERGENCY DEPARTMENT AT [facility] Provider Note   CSN: [id number] Arrival date & time: 03/11/23  1858     History  Chief Complaint  Patient presents with   Leg Injury    left    Karen Sanchez is a 37 y.o. female with a PMH of adjustment disorder who presented to the ED for a left leg injury.  Patient reports she works for [employer] and was reaching up in her truck to pick up a package, and felt a pop specifically in her left calf.  Denies the pop occurring in her knee or her ankle.  She denies numbness, tingling, weakness of her foot.  Denies swelling, redness, or bruising that she has noticed in the area.  States she has been ambulatory since this occurred.  Denies injury to any other part of her body including her head, chest, abdomen, arms, or right leg.  Denies history of injury to the knee.  Denies pain medication prior to arrival.  HPI     Home Medications Prior to Admission medications   Medication Sig Start Date End Date Taking? Authorizing Provider  cholestyramine (QUESTRAN) 4 g packet Take 4 g by mouth in the morning and at bedtime. 02/08/20 02/07/21  [provider]  clonazePAM (KLONOPIN) 1 MG tablet Take 1 mg by mouth at bedtime as needed. 07/23/20   [provider]  desvenlafaxine (PRISTIQ) 100 MG 24 hr tablet Take 100 mg by mouth daily.    [provider]  levonorgestrel (MIRENA) 20 MCG/DAY IUD 1 each by Intrauterine route once.    [provider]  omeprazole (PRILOSEC) 20 MG capsule TAKE 1 CAPSULE BY MOUTH EVERY DAY 05/17/22   Tower, Audrie Gallus, MD  promethazine (PHENERGAN) 25 MG tablet TAKE 1 TABLET BY MOUTH EVERY 8 HOURS AS NEEDED FOR NAUSEA OR VOMITING. 03/28/22   Tower, Audrie Gallus, MD  terconazole (TERAZOL 7) 0.4 % vaginal cream Apply to affected areas once daily 11/03/19   Tower, Audrie Gallus, MD  traZODone (DESYREL) 50 MG tablet Take 1 tablet (50 mg total) by mouth at bedtime as needed. 11/21/21   Tower, Audrie Gallus, MD       Allergies    Citric acid    Review of Systems   Review of Systems  Physical Exam Updated Vital Signs BP 118/86   Pulse 78   Temp 98.2 F (36.8 C) (Oral)   Resp 16   Ht 5\' 3"  (1.6 m)   Wt 81.6 kg   SpO2 100%   BMI 31.89 kg/m  Physical Exam Constitutional:      General: She is not in acute distress.    Appearance: Normal appearance. She is not ill-appearing.  HENT:     Head: Normocephalic and atraumatic.     Nose: Nose normal.     Mouth/Throat:     Mouth: Mucous membranes are moist.     Pharynx: Oropharynx is clear.  Eyes:     Pupils: Pupils are equal, round, and reactive to light.  Cardiovascular:     Rate and Rhythm: Normal rate and regular rhythm.     Pulses: Normal pulses.     Heart sounds: Normal heart sounds. No murmur heard.    No friction rub. No gallop.  Pulmonary:     Breath sounds: Normal breath sounds. No stridor. No wheezing, rhonchi or rales.  Musculoskeletal:     Cervical back: Normal range of motion and neck supple.     Right lower leg: No  edema.     Left lower leg: No edema.     Comments: Bilateral DP pulses 2+, sensation motor function intact in the feet.  Plantarflexion and dorsiflexion intact in the bilateral feet.  No patellar, joint line, popliteal fossa tenderness bilaterally.  Left leg with calf tenderness, no swelling, erythema, or open wounds.  Compartments soft in the bilateral lower extremities  Skin:    General: Skin is warm and dry.  Neurological:     General: No focal deficit present.     Mental Status: She is alert.     ED Results / Procedures / Treatments   Labs (all labs ordered are listed, but only abnormal results are displayed) Labs Reviewed - No data to display  EKG None  Radiology DG Tibia/Fibula Left  Result Date: 03/11/2023 CLINICAL DATA:  Pain after injury. EXAM: LEFT TIBIA AND FIBULA - 2 VIEW COMPARISON:  None Available. FINDINGS: There is no evidence of fracture or other focal bone lesions. The cortical margins  are intact. Knee and ankle alignment are maintained. Question of mild soft tissue edema. No radiopaque foreign body or soft tissue gas. IMPRESSION: Question of mild soft tissue edema. No fracture of the left tibia and fibula. Electronically Signed   By: Narda Rutherford M.D.   On: 03/11/2023 22:16    Procedures Procedures    Medications Ordered in ED Medications  acetaminophen (TYLENOL) tablet 1,000 mg (1,000 mg Oral Patient Refused/Not Given 03/11/23 2229)    ED Course/ Medical Decision Making/ A&P                                 Medical Decision Making Amount and/or Complexity of Data Reviewed Radiology: ordered.  Risk OTC drugs.  Vital signs stable, physical exam with mild calf tenderness to palpation.  I have low suspicion for Achilles tendon rupture as she has intact dorsiflexion and plantarflexion.  Additionally, her presentation is not consistent with a DVT as her calf pain started acutely after feeling a pop and she has no swelling.  Compartments are soft and patient has no extreme pain, numbness, or weakness in her foot concerning for compartment syndrome.  It is possible she may have ruptured a Baker's cyst in the back of her leg.  X-ray showed no evidence of fracture or dislocation.  She was placed in a cam boot for comfort.  I have discussed this with the patient and recommended she take Tylenol and Motrin as needed for pain control.  Additionally, I have provided her information to follow-up with orthopedics and a work note for the next several days.  Strict return precautions were discussed, patient voiced understanding.  Patient was discharged in stable condition.        Final Clinical Impression(s) / ED Diagnoses Final diagnoses:  Left leg pain    Rx / DC Orders ED Discharge Orders     None         Janyth Pupa, MD 03/11/23 2350    Arby Barrette, MD 03/18/23 [phone]

## 2023-03-11 NOTE — ED Triage Notes (Signed)
Pt to ED c/o left lower leg injury that occurred aprox 5 hours ago while at work. 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 Hurts to bare weight on leg. No redness or obvious injury noted.

## 2023-03-14 ENCOUNTER — Telehealth: Payer: Self-pay | Admitting: *Deleted

## 2023-03-14 ENCOUNTER — Encounter: Payer: Self-pay | Admitting: Family Medicine

## 2023-03-14 NOTE — Telephone Encounter (Signed)
Sport med may actually be better Does Dr Patsy Lager have anything available soon? If not/ the [facility] sport [facility] office in [place]?

## 2023-03-14 NOTE — Telephone Encounter (Signed)
See phone note

## 2023-03-14 NOTE — Telephone Encounter (Signed)
Note is in [employer]

## 2023-03-14 NOTE — Telephone Encounter (Addendum)
[facility] doesn't have any appts until next week so scheduled appt with Dr. Patsy Lager on 03/18/23 (1st appt), pt is asking if PCP can write the letter until she sees him on 03/18/23. If you write the note in her chart I can print it out here   Appt with PCP cancelled

## 2023-03-14 NOTE — Telephone Encounter (Signed)
Pt notified. She will just get letter off of her mychart she didn't need [place] to print it

## 2023-03-14 NOTE — Telephone Encounter (Signed)
Pt sent a message saying:  I was injured at work this week. The hospital only gave a note till the 5th. My next appointment with your office is not till the 10th. I still can't put weight on left leg. I will need a work note stating that I have an appointment on the 10th and will not be able to perform duties at work. Please call if you need any information.

## 2023-03-14 NOTE — Telephone Encounter (Signed)
Pt was referred to ortho at ER, pt called the Ortho that she was referred to and they said they don't handle her type of injury they handle more fractures and that she needed to call us to get a referral to new ortho. When pt called [place] she was told that we have to see her before PCP can pt new referral in, that's why she has the appt scheduled with PCP it's to discuss getting new referral.   Pt advised I will f/u when her when letter is done

## 2023-03-14 NOTE — Telephone Encounter (Signed)
From her ER note:   Additionally, I have provided her information to follow-up with orthopedics and a work note for the next several days.  Strict return precautions were discussed, patient voiced understanding.  Patient was discharged in stable condition.   Did they give her an ortho contact for an appointment?  It sounds like she would be better served seeing ortho or sport med as opposed to me   Let me know before I write the note

## 2023-03-17 NOTE — Progress Notes (Unsigned)
    Karen Yankowski T. Mickle Campton, MD, CAQ Sports Medicine [facility] at [facility] [address] [place], [address]  Phone: [phone]  FAX: [phone]  Karen Sanchez - 37 y.o. female  MRN [id number]  Date of Birth: 06/16/1985  Date: 03/18/2023  PCP: Judy Pimple, MD  Referral: Judy Pimple, MD  No chief complaint on file.  Subjective:   Karen Sanchez is a 37 y.o. very pleasant female patient with There is no height or weight on file to calculate BMI. who presents with the following:  Karen Sanchez at age 37.  I did see her once about 10 or 15 years ago she presents today with some ongoing left-sided leg pain.  She did previously go to the emergency room on March 11, 2023.  At that point she was found to have some left-sided leg pain.  On record review, it looks as if she felt the pop in her left leg and/or calf.  She was placed in a cam walker boot, and now she is here to see me in follow-up.  She did have a tib-fib x-ray that was normal.    Review of Systems is noted in the HPI, as appropriate  Objective:   There were no vitals taken for this visit.  GEN: No acute distress; alert,appropriate. PULM: Breathing comfortably in no respiratory distress PSYCH: Normally interactive.   Laboratory and Imaging Data:  Assessment and Plan:   ***

## 2023-03-18 ENCOUNTER — Encounter: Payer: Self-pay | Admitting: Family Medicine

## 2023-03-18 ENCOUNTER — Ambulatory Visit (INDEPENDENT_AMBULATORY_CARE_PROVIDER_SITE_OTHER): Payer: Worker's Compensation | Admitting: Family Medicine

## 2023-03-18 VITALS — BP 127/79 | HR 85 | Temp 97.7°F | Ht 63.0 in

## 2023-03-18 DIAGNOSIS — S86112A Strain of other muscle(s) and tendon(s) of posterior muscle group at lower leg level, left leg, initial encounter: Secondary | ICD-10-CM

## 2023-03-18 DIAGNOSIS — M79605 Pain in left leg: Secondary | ICD-10-CM

## 2023-03-18 NOTE — Patient Instructions (Addendum)
"  Hapad heel lifts" - try a 1/2 inch  Get a calf compression sleeve - amaz[employer] has a ton  - I like the body helix calf sleeve, but it is a lot more expensive

## 2023-03-18 NOTE — Telephone Encounter (Signed)
Can you please assist Crescentia with her physical therapy appointment - this is a worker's comp case.  Thanks!

## 2023-03-19 ENCOUNTER — Ambulatory Visit: Payer: Federal, State, Local not specified - PPO | Admitting: Family Medicine

## 2023-03-19 ENCOUNTER — Telehealth: Payer: Self-pay | Admitting: Family Medicine

## 2023-03-19 NOTE — Telephone Encounter (Signed)
Spoke to patient. She provided worker's compensation claim information:  Claim # [id number] DOI: 03/11/2023 Left Calf Case Manager: Verlan Friends, [phone] Referrals Network: [facility] Phone [phone]/ FAX [phone] [email or web address]

## 2023-03-20 ENCOUNTER — Telehealth: Payer: Federal, State, Local not specified - PPO | Admitting: Family Medicine

## 2023-03-21 ENCOUNTER — Encounter: Payer: Self-pay | Admitting: Family Medicine

## 2023-03-22 NOTE — Telephone Encounter (Signed)
I am not sure who this information needs to go to?  Hopefully, you all know about this for [employer].

## 2023-04-09 ENCOUNTER — Telehealth: Payer: Self-pay | Admitting: Family Medicine

## 2023-04-09 NOTE — Telephone Encounter (Signed)
 Copied from CRM [id number]. Topic: General - Other >> Apr 09, 2023  9:38 AM Elizebeth Brooking wrote: Reason for CRM: Patient called in wanting to speak with Lauralee Evener regarding a referral. She is requesting a callback

## 2023-04-10 NOTE — Progress Notes (Signed)
 Kariah Loredo T. Tavon Corriher, MD, CAQ Sports Medicine [facility] at [facility] [address] [place], [address]  Phone: [phone]  FAX: [phone]  Karen Sanchez - 38 y.o. female  MRN [id number]  Date of Birth: Mar 12, 1986  Date: 04/11/2023  PCP: Randeen Laine LABOR, MD  Referral: Randeen Laine LABOR, MD  Chief Complaint  Patient presents with  . Leg Injury    Follow Up-PPW to Return to Work   Subjective:   Karen Sanchez is a 37 y.o. very pleasant female patient with Body mass index is 31.95 kg/m. who presents with the following:  She is here to follow-up on left-sided gastrocnemius tear.  The last time I saw her, I did make her partial weightbearing and graft a lift for her affected left side.  At that time, she was also taken out of work due to injury.  At this point, she is doing dramatically better compared to her initial evaluation.  She is not having any pain with compression of the calf at all, and she is walking with no significant limp.  She does still have some aching occasionally and some pain with terminal dorsiflexion, however this is much improved and fairly mild in character in general.  Almost completely better  RTW 04/15/2023  Review of Systems is noted in the HPI, as appropriate  Objective:   BP 100/66 (BP Location: Right Arm, Patient Position: Sitting, Cuff Size: Large)   Pulse 94   Temp 97.6 F (36.4 C) (Temporal)   Ht 5' 3 (1.6 m)   Wt 180 lb 6 oz (81.8 kg)   SpO2 99%   BMI 31.95 kg/m   GEN: No acute distress; alert,appropriate. PULM: Breathing comfortably in no respiratory distress PSYCH: Normally interactive.   No limp with walking No tenderness with palpation of the gastrocnemius or soleus.  Nontender along the Achilles, as well. Strength testing at the ankle is full in all directions Neurovascularly intact  Laboratory and Imaging Data:  Assessment and Plan:     ICD-10-CM   1. Gastrocnemius muscle tear,  left, subsequent encounter  S86.112D      She has had excellent recovery from medial gastrocnemius tear.  I gave her some basic range of motion and strengthening work for the calf and anterior tib muscles.  I would anticipate that she will continue to do well.  I am going to release her to work at full duty.  Return to work April 15, 2023.  F/u prn  [employer] speech-to-text software was used for transcription in this dictation.  Possible transcriptional errors can occur using Animal nutritionist.   Signed,  Jacques DASEN. Glorya Bartley, MD   Outpatient Encounter Medications as of 04/11/2023  Medication Sig  . AUVELITY 45-105 MG TBCR Take 1 tablet by mouth in the morning and at bedtime.  . cholestyramine (QUESTRAN) 4 g packet Take 4 g by mouth in the morning and at bedtime.  . levonorgestrel  (MIRENA ) 20 MCG/DAY IUD 1 each by Intrauterine route once.  . omeprazole  (PRILOSEC) 20 MG capsule TAKE 1 CAPSULE BY MOUTH EVERY DAY  . promethazine  (PHENERGAN ) 25 MG tablet TAKE 1 TABLET BY MOUTH EVERY 8 HOURS AS NEEDED FOR NAUSEA OR VOMITING.  . terconazole  (TERAZOL 7 ) 0.4 % vaginal cream Apply to affected areas once daily  . traZODone  (DESYREL ) 50 MG tablet Take 1 tablet (50 mg total) by mouth at bedtime as needed.   No facility-administered encounter medications on file as of 04/11/2023.

## 2023-04-11 ENCOUNTER — Ambulatory Visit (INDEPENDENT_AMBULATORY_CARE_PROVIDER_SITE_OTHER): Payer: Worker's Compensation | Admitting: Family Medicine

## 2023-04-11 ENCOUNTER — Encounter: Payer: Self-pay | Admitting: Family Medicine

## 2023-04-11 VITALS — BP 100/66 | HR 94 | Temp 97.6°F | Ht 63.0 in | Wt 180.4 lb

## 2023-04-11 DIAGNOSIS — S86112D Strain of other muscle(s) and tendon(s) of posterior muscle group at lower leg level, left leg, subsequent encounter: Secondary | ICD-10-CM | POA: Diagnosis not present

## 2023-04-15 ENCOUNTER — Encounter: Payer: Self-pay | Admitting: Family Medicine

## 2023-04-15 ENCOUNTER — Telehealth (INDEPENDENT_AMBULATORY_CARE_PROVIDER_SITE_OTHER): Payer: BC Managed Care – PPO | Admitting: Family Medicine

## 2023-04-15 VITALS — Ht 63.0 in | Wt 180.0 lb

## 2023-04-15 DIAGNOSIS — F322 Major depressive disorder, single episode, severe without psychotic features: Secondary | ICD-10-CM | POA: Insufficient documentation

## 2023-04-15 DIAGNOSIS — E669 Obesity, unspecified: Secondary | ICD-10-CM | POA: Diagnosis not present

## 2023-04-15 DIAGNOSIS — F4322 Adjustment disorder with anxiety: Secondary | ICD-10-CM | POA: Diagnosis not present

## 2023-04-15 DIAGNOSIS — K2950 Unspecified chronic gastritis without bleeding: Secondary | ICD-10-CM | POA: Diagnosis not present

## 2023-04-15 DIAGNOSIS — Z6831 Body mass index (BMI) 31.0-31.9, adult: Secondary | ICD-10-CM

## 2023-04-15 NOTE — Assessment & Plan Note (Signed)
 Pt is seeing psychiatry  Has been medicine resistant On auvelity now- so far not much difference Trazodone for sleep   Not suicidal

## 2023-04-15 NOTE — Assessment & Plan Note (Signed)
 38 yo pt with bmi of 31.89, inquiring about pharma options to help weight loss Lost 20 lb with diet /exercise and gained 6 lb back  Is doing cardio/walking though recent calf tear injury has slowed that down   Discussed how this problem influences overall health and the risks it imposes  Reviewed plan for weight loss with lower calorie diet (via better food choices (lower glycemic and portion control) along with exercise building up to or more than 30 minutes 5 days per week including some aerobic activity and strength training   Will add more work with her exercise bands  Will try and cut back on simple carbs and increase protein (given list of protein options)  May be a candidate for [employer] or [employer] program-she will check out  Already on buproprion product for depression   Instructed pt to check with ins re: covered alternatives and get us  a list  Cost will be limiting factor  Made her aware that I do not prescribe stimulants due to risks  Optimally glp-1 would help but doubt covered

## 2023-04-15 NOTE — Assessment & Plan Note (Signed)
 Per pt-has am nausea (not pregnant) daily -making it difficult to eat properly Under GI care Takes ppi /omeprazole 20 mg daily

## 2023-04-15 NOTE — Assessment & Plan Note (Signed)
 Pt is seeing psychiatry  Has been medicine resistant On auvelity now- so far not much difference Trazodone for sleep

## 2023-04-15 NOTE — Patient Instructions (Addendum)
 The following are examples of protein in diet : Meat  Fish  Eggs  Dairy products  Soy products  Oat milk  Almond milk Nuts and nut butters  Dried beans   In terms of carbs Try to get most of your carbohydrates from produce (with the exception of white potatoes) and whole grains Eat less bread/pasta/rice/snack foods/cereals/sweets and other items from the middle of the grocery store (processed carbs)    Focus more on strength training if you can Add some strength training to your routine, this is important for bone and brain health and can reduce your risk of falls and help your body use insulin properly and regulate weight  Light weights, exercise bands , and internet videos are a good way to start  Yoga (chair or regular), machines , floor exercises or a gym with machines are also good options    Check out NOOM and weight watchers options   Get a list of covered medicines from your ins co and let me know

## 2023-04-15 NOTE — Progress Notes (Signed)
 Virtual Visit via Video Note  I connected with Karen Sanchez on 04/15/23 at 10:00 AM EST by a video enabled telemedicine application and verified that I am speaking with the correct person using two identifiers.  Patient Location: Other:  car at her workplace (in parking lot) Provider Location: Office/Clinic  I discussed the limitations, risks, security, and privacy concerns of performing an evaluation and management service by video and the availability of in person appointments. I also discussed with the patient that there may be a patient responsible charge related to this service. The patient expressed understanding and agreed to proceed.  Parties involved in encounter  Patient: Karen Sanchez   Provider:  Laine Balls MD   Subjective: PCP: Sanchez Karen LABOR, MD  CC: address obesity  HPI Pt presents to discuss weight /obesity/fitness  Wt Readings from Last 3 Encounters:  04/15/23 180 lb (81.6 kg)  04/11/23 180 lb 6 oz (81.8 kg)  03/11/23 180 lb (81.6 kg)   31.89 kg/m  Interested in weight loss medication  Eating better  Has lost weight   Down from 195 to 174 lb- then back up to 180 lb  Seems stalled   Does not eat during the day  Eats at night  Eats too much but eating pretty healthy   Cooks at home  Lots of vegetables  A little fruit  Does not like meat (some chicken and turkey, not much meat) Too many carbs (rice/potato) Avoids fatty foods  Fast food at most once per week  Some fish  Some eggs -has chickens  Not much milk but some other dairy  Soy, some rice milk Loves nuts    Some beans for protein      Tends to be nauseated in am (will drink some coffee)  Thinks this chronic GI condition   Also recently injured at work/tore a calf muscle  Today is first day back   Walking for exercise  Also physical job/delivery of mail   Resistance bands- just started  15 minutes about 2 days   Depression  Med resistant Auvelity now -  dextromethorphan- bupropion  Unsure if helping   Is not an emotional eater      ROS: Per HPI Review of Systems  Constitutional:  Positive for malaise/fatigue. Negative for chills and fever.       Some fatigue   HENT:  Negative for congestion, ear pain, sinus pain and sore throat.   Eyes:  Negative for blurred vision, discharge and redness.  Respiratory:  Negative for cough, shortness of breath and stridor.   Cardiovascular:  Negative for chest pain, palpitations and leg swelling.  Gastrointestinal:  Negative for abdominal pain, diarrhea, nausea and vomiting.  Musculoskeletal:  Negative for myalgias.       Dealing with a calf muscle tear currently  Skin:  Negative for rash.  Neurological:  Negative for dizziness and headaches.  Psychiatric/Behavioral:  Positive for depression. Negative for suicidal ideas. The patient is nervous/anxious.      Current Outpatient Medications:    AUVELITY 45-105 MG TBCR, Take 1 tablet by mouth in the morning and at bedtime., Disp: , Rfl:    cholestyramine (QUESTRAN) 4 g packet, Take 4 g by mouth in the morning and at bedtime., Disp: , Rfl:    levonorgestrel  (MIRENA ) 20 MCG/DAY IUD, 1 each by Intrauterine route once., Disp: , Rfl:    omeprazole  (PRILOSEC) 20 MG capsule, TAKE 1 CAPSULE BY MOUTH EVERY DAY, Disp: 90 capsule, Rfl: 0  promethazine  (PHENERGAN ) 25 MG tablet, TAKE 1 TABLET BY MOUTH EVERY 8 HOURS AS NEEDED FOR NAUSEA OR VOMITING., Disp: 30 tablet, Rfl: 3   terconazole  (TERAZOL 7 ) 0.4 % vaginal cream, Apply to affected areas once daily, Disp: 45 g, Rfl: 0   traZODone  (DESYREL ) 50 MG tablet, Take 1 tablet (50 mg total) by mouth at bedtime as needed., Disp: 90 tablet, Rfl: 2  Observations/Objective: Today's Vitals   04/15/23 0954  Weight: 180 lb (81.6 kg)  Height: 5' 3 (1.6 m)   Physical Exam  Patient appears well, in no distress Weight is baseline  No facial swelling or asymmetry Normal voice-not hoarse and no slurred speech No obvious  tremor or mobility impairment Moving neck and UEs normally Able to hear the call well  No cough or shortness of breath during interview  Talkative and mentally sharp with no cognitive changes No skin changes on face or neck , no rash or pallor Affect is blunted , not tearful or down seeming   Assessment and Plan: Obesity (BMI 30-39.9) Assessment & Plan: 38 yo pt with bmi of 31.89, inquiring about pharma options to help weight loss Lost 20 lb with diet /exercise and gained 6 lb back  Is doing cardio/walking though recent calf tear injury has slowed that down   Discussed how this problem influences overall health and the risks it imposes  Reviewed plan for weight loss with lower calorie diet (via better food choices (lower glycemic and portion control) along with exercise building up to or more than 30 minutes 5 days per week including some aerobic activity and strength training   Will add more work with her exercise bands  Will try and cut back on simple carbs and increase protein (given list of protein options)  May be a candidate for NOOM or [employer] program-she will check out  Already on buproprion product for depression   Instructed pt to check with ins re: covered alternatives and get us  a list  Cost will be limiting factor  Made her aware that I do not prescribe stimulants due to risks  Optimally glp-1 would help but doubt covered        Adjustment disorder with anxiety Assessment & Plan: Pt is seeing psychiatry  Has been medicine resistant On auvelity now- so far not much difference Trazodone  for sleep      Chronic gastritis without bleeding, unspecified gastritis type Assessment & Plan: Per pt-has am nausea (not pregnant) daily -making it difficult to eat properly Under GI care Takes ppi /omeprazole  20 mg daily     Severe major depressive disorder (HCC) Assessment & Plan: Pt is seeing psychiatry  Has been medicine resistant On auvelity now- so far not much  difference Trazodone  for sleep   Not suicidal     Follow Up Instructions: The following are examples of protein in diet : Meat  Fish  Eggs  Dairy products  Soy products  Oat milk  Almond milk Nuts and nut butters  Dried beans   In terms of carbs Try to get most of your carbohydrates from produce (with the exception of white potatoes) and whole grains Eat less bread/pasta/rice/snack foods/cereals/sweets and other items from the middle of the grocery store (processed carbs)    Focus more on strength training if you can Add some strength training to your routine, this is important for bone and brain health and can reduce your risk of falls and help your body use insulin properly and regulate weight  Light  weights, exercise bands , and internet videos are a good way to start  Yoga (chair or regular), machines , floor exercises or a gym with machines are also good options    Check out NOOM and weight watchers options   Get a list of covered medicines from your ins co and let me know    I discussed the assessment and treatment plan with the patient. The patient was provided an opportunity to ask questions, and all were answered. The patient agreed with the plan and demonstrated an understanding of the instructions.   The patient was advised to call back or seek an in-person evaluation if the symptoms worsen or if the condition fails to improve as anticipated.  The above assessment and management plan was discussed with the patient. The patient verbalized understanding of and has agreed to the management plan.   Karen Balls, MD

## 2023-04-30 NOTE — Telephone Encounter (Signed)
Not our patient

## 2023-06-25 ENCOUNTER — Ambulatory Visit: Admitting: Family Medicine

## 2023-08-19 ENCOUNTER — Encounter: Payer: Self-pay | Admitting: Family Medicine

## 2023-08-19 ENCOUNTER — Other Ambulatory Visit (HOSPITAL_COMMUNITY): Payer: Self-pay

## 2023-08-19 ENCOUNTER — Telehealth: Payer: Self-pay | Admitting: *Deleted

## 2023-08-19 ENCOUNTER — Other Ambulatory Visit: Payer: Self-pay | Admitting: Family Medicine

## 2023-08-19 MED ORDER — OMEPRAZOLE 20 MG PO CPDR
20.0000 mg | DELAYED_RELEASE_CAPSULE | Freq: Every day | ORAL | 0 refills | Status: DC
  Filled 2023-08-19: qty 90, 90d supply, fill #0

## 2023-08-19 MED ORDER — ZEPBOUND 2.5 MG/0.5ML ~~LOC~~ SOAJ: 2.5000 mg | SUBCUTANEOUS | 0 refills | Status: DC

## 2023-08-19 NOTE — Telephone Encounter (Signed)
 Pt responded saying:  I have been going through weight watchers. I have been taking the medicine since January. I steadily increase every month. Now I am on 2.5mg /ml and take 20 units once a week. I have went down from 195 pounds to 155 pounds so I want to try to continue with the medication. My goal is 135 and maintain.

## 2023-08-19 NOTE — Telephone Encounter (Signed)
 Pt sent a note saying:  We spoke a few months back about zepbound. My insurance covers this medication now with prior authorization. I was wondering if we could start this for me. I am already on this medicine since January and doing great,however since the new change they are no longer charging the same prices. I have a savings card that should help with the cost. Please advise. Thanks

## 2023-08-19 NOTE — Telephone Encounter (Signed)
 Pt responded and said:  2.5 mg. No side effects. I would like to see if she would do a new prescription for me.

## 2023-08-19 NOTE — Telephone Encounter (Signed)
 Pt had a virtual visit on 04/15/23 to discuss weight loss/med but no recent or future CPE and hasn't had labs done in years, please advise

## 2023-08-19 NOTE — Telephone Encounter (Signed)
 I sent it to her pharmacy to see if it gets covered for obesity  2.5 mg weekly  If this gets covered then please follow up with me approx a month after starting it   Also want to make sure she is doing some resistance training exercise to prevent muscle loss/ very important

## 2023-08-19 NOTE — Telephone Encounter (Signed)
 Please set up a health mt /annual exam Thanks I refilled once

## 2023-08-19 NOTE — Telephone Encounter (Signed)
 Zepbound goes from 2.5 mg weekly- then titrated up to a max of 15 mg weekly  Sorry- she said units?   How many mg weekly?   Glad she is doing well on it  Any side effects ?

## 2023-08-19 NOTE — Telephone Encounter (Signed)
 Called  pt and schedule a appt for cpe / labs

## 2023-08-19 NOTE — Telephone Encounter (Signed)
 She also sent a pic of the vial she uses, please see pic under media tab

## 2023-08-19 NOTE — Telephone Encounter (Signed)
 She is taking the medicine already?  What dose?  Was she going to a weight clinic/   please clarify , thanks

## 2023-08-20 ENCOUNTER — Encounter: Payer: Self-pay | Admitting: Pharmacist

## 2023-08-20 ENCOUNTER — Telehealth: Payer: Self-pay

## 2023-08-20 ENCOUNTER — Other Ambulatory Visit: Payer: Self-pay

## 2023-08-20 ENCOUNTER — Other Ambulatory Visit (HOSPITAL_COMMUNITY): Payer: Self-pay

## 2023-08-20 NOTE — Telephone Encounter (Signed)
PA was denied, letter placed in PCP's inbox for review  ?

## 2023-08-20 NOTE — Telephone Encounter (Signed)
 Pharmacy Patient Advocate Encounter   Received notification from Patient Pharmacy that prior authorization for Zepbound 2.5 is required/requested.   Insurance verification completed.   The patient is insured through [facility] .   Per test claim: PA required; PA submitted to above mentioned insurance via CoverMyMeds Key/confirmation #/EOC [id number] Status is pending

## 2023-08-20 NOTE — Telephone Encounter (Signed)
Sent mychart letting pt know Dr. Tower's comments. 

## 2023-08-20 NOTE — Telephone Encounter (Signed)
 Pharmacy Patient Advocate Encounter  Received notification from [facility] that Prior Authorization for Zepbound 2.5 has been DENIED.  Full denial letter will be uploaded to the media tab. See denial reason below.   PA #/Case ID/Reference #: [id number]

## 2023-08-22 NOTE — Telephone Encounter (Signed)
 Per chart review medication not covered by insurance. Please advise next steps. Sending message to patient to make aware.

## 2023-08-22 NOTE — Telephone Encounter (Signed)
 I assume we already did a prior auth? Let me know  If so do I need to re send the med to get one? Thanks

## 2023-08-23 ENCOUNTER — Other Ambulatory Visit: Payer: Self-pay

## 2023-08-23 NOTE — Telephone Encounter (Signed)
 PA was done and denied. A denial letter was placed in PCP's inbox a few days ago also (see separate phone note).  This is also the note in chart from PA dpt.  Pharmacy Patient Advocate Encounter   Received notification from [facility] that Prior Authorization for Zepbound 2.5 has been DENIED.  Full denial letter will be uploaded to the media tab. See denial reason below.    PA #/Case ID/Reference #: [id number]

## 2023-08-23 NOTE — Telephone Encounter (Deleted)
 PA was done and denied. A denial letter was placed in PCP's inbox a few days ago also (see separate phone note).  This is also the note in chart from PA dpt.  Pharmacy Patient Advocate Encounter   Received notification from [facility] that Prior Authorization for Zepbound 2.5 has been DENIED.  Full denial letter will be uploaded to the media tab. See denial reason below.    PA #/Case ID/Reference #: [id number]

## 2023-08-26 ENCOUNTER — Telehealth: Payer: Self-pay | Admitting: Family Medicine

## 2023-08-26 DIAGNOSIS — Z79899 Other long term (current) drug therapy: Secondary | ICD-10-CM | POA: Insufficient documentation

## 2023-08-26 DIAGNOSIS — E669 Obesity, unspecified: Secondary | ICD-10-CM

## 2023-08-26 DIAGNOSIS — Z0000 Encounter for general adult medical examination without abnormal findings: Secondary | ICD-10-CM

## 2023-08-26 DIAGNOSIS — Z131 Encounter for screening for diabetes mellitus: Secondary | ICD-10-CM | POA: Insufficient documentation

## 2023-08-26 NOTE — Telephone Encounter (Signed)
-----   Message from Gerry Krone sent at 08/23/2023  8:47 AM EDT ----- Regarding: Lab orders for Wed, 5.28.25 Patient is scheduled for CPX labs, please order future labs, Thanks , [name]

## 2023-09-04 ENCOUNTER — Other Ambulatory Visit (INDEPENDENT_AMBULATORY_CARE_PROVIDER_SITE_OTHER)

## 2023-09-04 DIAGNOSIS — Z0000 Encounter for general adult medical examination without abnormal findings: Secondary | ICD-10-CM | POA: Diagnosis not present

## 2023-09-04 DIAGNOSIS — Z131 Encounter for screening for diabetes mellitus: Secondary | ICD-10-CM

## 2023-09-04 DIAGNOSIS — Z79899 Other long term (current) drug therapy: Secondary | ICD-10-CM | POA: Diagnosis not present

## 2023-09-04 DIAGNOSIS — E669 Obesity, unspecified: Secondary | ICD-10-CM | POA: Diagnosis not present

## 2023-09-04 DIAGNOSIS — F3342 Major depressive disorder, recurrent, in full remission: Secondary | ICD-10-CM | POA: Diagnosis not present

## 2023-09-04 DIAGNOSIS — F4310 Post-traumatic stress disorder, unspecified: Secondary | ICD-10-CM | POA: Diagnosis not present

## 2023-09-04 LAB — CBC WITH DIFFERENTIAL/PLATELET
Basophils Absolute: 0 10*3/uL (ref 0.0–0.1)
Basophils Relative: 0.3 % (ref 0.0–3.0)
Eosinophils Absolute: 0.1 10*3/uL (ref 0.0–0.7)
Eosinophils Relative: 2.5 % (ref 0.0–5.0)
HCT: 40.3 % (ref 36.0–46.0)
Hemoglobin: 13.6 g/dL (ref 12.0–15.0)
Lymphocytes Relative: 25.1 % (ref 12.0–46.0)
Lymphs Abs: 1.5 10*3/uL (ref 0.7–4.0)
MCHC: 33.6 g/dL (ref 30.0–36.0)
MCV: 87.2 fl (ref 78.0–100.0)
Monocytes Absolute: 0.6 10*3/uL (ref 0.1–1.0)
Monocytes Relative: 10.6 % (ref 3.0–12.0)
Neutro Abs: 3.6 10*3/uL (ref 1.4–7.7)
Neutrophils Relative %: 61.5 % (ref 43.0–77.0)
Platelets: 305 10*3/uL (ref 150.0–400.0)
RBC: 4.63 Mil/uL (ref 3.87–5.11)
RDW: 13.2 % (ref 11.5–15.5)
WBC: 5.9 10*3/uL (ref 4.0–10.5)

## 2023-09-04 LAB — COMPREHENSIVE METABOLIC PANEL WITH GFR
ALT: 24 U/L (ref 0–35)
AST: 15 U/L (ref 0–37)
Albumin: 4.1 g/dL (ref 3.5–5.2)
Alkaline Phosphatase: 63 U/L (ref 39–117)
BUN: 9 mg/dL (ref 6–23)
CO2: 32 meq/L (ref 19–32)
Calcium: 8.9 mg/dL (ref 8.4–10.5)
Chloride: 104 meq/L (ref 96–112)
Creatinine, Ser: 0.83 mg/dL (ref 0.40–1.20)
GFR: 89.22 mL/min (ref >60.00)
Glucose, Bld: 85 mg/dL (ref 70–99)
Potassium: 3.8 meq/L (ref 3.5–5.1)
Sodium: 139 meq/L (ref 135–145)
Total Bilirubin: 0.5 mg/dL (ref 0.2–1.2)
Total Protein: 6.2 g/dL (ref 6.0–8.3)

## 2023-09-04 LAB — LIPID PANEL
Cholesterol: 175 mg/dL (ref 0–200)
HDL: 45 mg/dL (ref >39.00)
LDL Cholesterol: 103 mg/dL — ABNORMAL HIGH (ref 0–99)
NonHDL: 129.81
Total CHOL/HDL Ratio: 4
Triglycerides: 132 mg/dL (ref 0.0–149.0)
VLDL: 26.4 mg/dL (ref 0.0–40.0)

## 2023-09-04 LAB — VITAMIN D 25 HYDROXY (VIT D DEFICIENCY, FRACTURES): VITD: 16.96 ng/mL — ABNORMAL LOW (ref 30.00–100.00)

## 2023-09-04 LAB — HEMOGLOBIN A1C: Hgb A1c MFr Bld: 5.1 % (ref 4.6–6.5)

## 2023-09-04 LAB — TSH: TSH: 0.87 u[IU]/mL (ref 0.35–5.50)

## 2023-09-04 LAB — VITAMIN B12: Vitamin B-12: 193 pg/mL — ABNORMAL LOW (ref 211–911)

## 2023-09-05 ENCOUNTER — Telehealth: Payer: Self-pay | Admitting: *Deleted

## 2023-09-05 ENCOUNTER — Ambulatory Visit: Payer: Self-pay | Admitting: Family Medicine

## 2023-09-05 NOTE — Telephone Encounter (Signed)
 Copied from CRM [id number]. Topic: General - Other >> Sep 05, 2023 12:46 PM Dorisann Garre T wrote: Reason for CRM: dr from [facility] is needing to do a peer to peer for patient for zepbound  medication he is needing a pa this is time sensitive as well callback number is [phone]

## 2023-09-05 NOTE — Telephone Encounter (Signed)
 I will not be back until Monday  If this is time sensitive then we may need to start prior auth over  Please let pt know and send back to me with further instructions for early next week

## 2023-09-06 NOTE — Telephone Encounter (Signed)
 We have tried to do prior auth and it was denied see other phone note, pt is trying to do an appeal and they are requesting a peer to peer to complete that. Will route back to PCP to call once return on Monday

## 2023-09-09 NOTE — Telephone Encounter (Signed)
 Just saw it- was going to request office visit and see she has one for tomorrow  Will discuss it then

## 2023-09-09 NOTE — Telephone Encounter (Signed)
 Looks like appeal was denied, letter placed in PCP's inbox for review

## 2023-09-10 ENCOUNTER — Ambulatory Visit (INDEPENDENT_AMBULATORY_CARE_PROVIDER_SITE_OTHER): Admitting: Family Medicine

## 2023-09-10 ENCOUNTER — Encounter: Payer: Self-pay | Admitting: Family Medicine

## 2023-09-10 VITALS — BP 96/58 | HR 85 | Temp 98.4°F | Ht 62.25 in | Wt 156.4 lb

## 2023-09-10 DIAGNOSIS — Z0000 Encounter for general adult medical examination without abnormal findings: Secondary | ICD-10-CM | POA: Diagnosis not present

## 2023-09-10 DIAGNOSIS — N920 Excessive and frequent menstruation with regular cycle: Secondary | ICD-10-CM

## 2023-09-10 DIAGNOSIS — K2950 Unspecified chronic gastritis without bleeding: Secondary | ICD-10-CM

## 2023-09-10 DIAGNOSIS — E669 Obesity, unspecified: Secondary | ICD-10-CM

## 2023-09-10 DIAGNOSIS — F322 Major depressive disorder, single episode, severe without psychotic features: Secondary | ICD-10-CM

## 2023-09-10 DIAGNOSIS — E538 Deficiency of other specified B group vitamins: Secondary | ICD-10-CM | POA: Diagnosis not present

## 2023-09-10 DIAGNOSIS — F4322 Adjustment disorder with anxiety: Secondary | ICD-10-CM

## 2023-09-10 DIAGNOSIS — K580 Irritable bowel syndrome with diarrhea: Secondary | ICD-10-CM

## 2023-09-10 DIAGNOSIS — Z79899 Other long term (current) drug therapy: Secondary | ICD-10-CM | POA: Diagnosis not present

## 2023-09-10 DIAGNOSIS — Z131 Encounter for screening for diabetes mellitus: Secondary | ICD-10-CM

## 2023-09-10 DIAGNOSIS — E559 Vitamin D deficiency, unspecified: Secondary | ICD-10-CM | POA: Insufficient documentation

## 2023-09-10 MED ORDER — CYANOCOBALAMIN 1000 MCG/ML IJ SOLN
1000.0000 ug | Freq: Once | INTRAMUSCULAR | Status: AC
  Administered 2023-09-10: 1000 ug via INTRAMUSCULAR

## 2023-09-10 NOTE — Assessment & Plan Note (Signed)
 Continues questran powder and it still helps

## 2023-09-10 NOTE — Assessment & Plan Note (Signed)
 Lab Results  Component Value Date   HGBA1C 5.1 09/04/2023   HGBA1C 5.9 09/04/2018   Improved Commended  Taking glp-1 and losing weight

## 2023-09-10 NOTE — Assessment & Plan Note (Signed)
 Continues psychiatric care  Auvelty 45-105 mg bid Trazodone  for sleep 50 mg prn  No current SI  Considering possible ketamine? Treatment in future   Pt has good support  Encouraged good self care

## 2023-09-10 NOTE — Assessment & Plan Note (Signed)
 Resolved with mirena  iud No periods  In past depo provera  caused weight gain

## 2023-09-10 NOTE — Assessment & Plan Note (Signed)
 Continues omeprazole  Per pt GI did not have much more to offer

## 2023-09-10 NOTE — Patient Instructions (Addendum)
 Add more strength training to your routine, this is important for bone and brain health and can reduce your risk of falls and help your body use insulin properly and regulate weight  Light weights, exercise bands , and internet videos are a good way to start  Yoga (chair or regular), machines , floor exercises or a gym with machines are also good options   Stick with your vitamin D  daily  Also vitamin B12   B12 shot today  Let's re check vitamin levels in 2 months   Keep up the good work  Try to get most of your carbohydrates from produce (with the exception of white potatoes) and whole grains Eat less bread/pasta/rice/snack foods/cereals/sweets and other items from the middle of the grocery store (processed carbs)  Labs look good

## 2023-09-10 NOTE — Assessment & Plan Note (Addendum)
 Last vitamin D  Lab Results  Component Value Date   VD25OH 16.96 (L) 09/04/2023   Has started 2000 international units D3 daily  Discussed importance for bone and general health  Will re check in 2 months

## 2023-09-10 NOTE — Progress Notes (Signed)
 Subjective:    Patient ID: Karen Sanchez, female    DOB: 1985-06-04, 38 y.o.   MRN: [id number]  HPI  Here for health maintenance exam and to review chronic medical problems   Wt Readings from Last 3 Encounters:  09/10/23 156 lb 6 oz (70.9 kg)  04/15/23 180 lb (81.6 kg)  04/11/23 180 lb 6 oz (81.8 kg)   28.37 kg/m  Vitals:   09/10/23 1425  BP: (!) 96/58  Pulse: 85  Temp: 98.4 F (36.9 C)  SpO2: 98%    Immunization History  Administered Date(s) Administered   Hpv-Unspecified 04/23/2006, 07/01/2006, 10/28/2006   Influenza,inj,Quad PF,6+ Mos 02/16/2016, 02/21/2017, 02/05/2019, 01/06/2020   MMR 05/04/2010   Moderna Sars-Covid-2 Vaccination 03/28/2020   PFIZER(Purple Top)SARS-COV-2 Vaccination 06/21/2019, 07/13/2019   Td 03/26/2006   Tdap 02/21/2017    There are no preventive care reminders to display for this patient.    Maternal aunt with breast cancer  Self breast exam- no new lumps  Has had breast cysts in the past  Will start mammograms at 80    Gyn health 08/2018 normal with neg HPV  Mirena  iud (? 2022- 7 years)  No gyn problems  Declines exam or pap today   Menses  -none with mirena      Bone health   Falls- none Fractures-none  Supplements  Last vitamin D  Lab Results  Component Value Date   VD25OH 16.96 (L) 09/04/2023    Exercise  Lifting Walking    Mood    09/10/2023    2:32 PM 04/25/2022    9:06 AM 10/23/2021    9:06 AM 08/16/2020    9:15 AM 03/09/2015    4:14 PM  Depression screen PHQ 2/9  Decreased Interest 0 3 0 1 0  Down, Depressed, Hopeless 2 3 0 3 0  PHQ - 2 Score 2 6 0 4 0  Altered sleeping 3 3  3    Tired, decreased energy 3 3  3    Change in appetite 0 3  1   Feeling bad or failure about yourself  0 2  0   Trouble concentrating 0 1  0   Moving slowly or fidgety/restless 0 0  0   Suicidal thoughts 0 2  0   PHQ-9 Score 8 20  11    Difficult doing work/chores Somewhat difficult Very difficult  Somewhat difficult     Sees psychiatry  Auvelity 45-105 mg bid - is working better than pristiq  May be considering ketamine treatment option in future  Trazodone  50 mg prn for sleep    Obesity  Zepbound  denied due to pref wegovy - has 2.5 mg left- has enough to get through rest of the year    [facility] however was tier 3 so pt would need to pay 60% of the cost  (500$) per month May cover stimulants- I do not feel comfortable prescribing those  Zepbound  works better   Eating better  Less cravings   Exercise - lots  Lifting/bend and walks at work    GERD Omeprazole  20 mg daily  No upcoming visits with GI  Nothing more was offered    Lab Results  Component Value Date   VITAMINB12 193 (L) 09/04/2023   Takes questran for IBS   Lab Results  Component Value Date   WBC 5.9 09/04/2023   HGB 13.6 09/04/2023   HCT 40.3 09/04/2023   MCV 87.2 09/04/2023   PLT 305.0 09/04/2023   Lab Results  Component Value Date  NA 139 09/04/2023   K 3.8 09/04/2023   CO2 32 09/04/2023   GLUCOSE 85 09/04/2023   BUN 9 09/04/2023   CREATININE 0.83 09/04/2023   CALCIUM 8.9 09/04/2023   GFR 89.22 09/04/2023   GFRNONAA 117.94 01/11/2010   Lab Results  Component Value Date   ALT 24 09/04/2023   AST 15 09/04/2023   ALKPHOS 63 09/04/2023   BILITOT 0.5 09/04/2023    Lab Results  Component Value Date   HGBA1C 5.1 09/04/2023     Cholesterol  Lab Results  Component Value Date   CHOL 175 09/04/2023   CHOL 208 (H) 09/03/2018   CHOL 212 (H) 03/11/2013   Lab Results  Component Value Date   HDL 45.00 09/04/2023   HDL 54.10 09/03/2018   HDL 60.70 03/11/2013   Lab Results  Component Value Date   LDLCALC 103 (H) 09/04/2023   LDLCALC 127 (H) 09/03/2018   Lab Results  Component Value Date   TRIG 132.0 09/04/2023   TRIG 135.0 09/03/2018   TRIG 91.0 03/11/2013   Lab Results  Component Value Date   CHOLHDL 4 09/04/2023   CHOLHDL 4 09/03/2018   CHOLHDL 3 03/11/2013   Lab Results  Component Value  Date   LDLDIRECT 139.2 03/11/2013   LDLDIRECT 126.1 01/11/2010       Patient Active Problem List   Diagnosis Date Noted   Vitamin B12 deficiency 09/10/2023   Vitamin D  deficiency 09/10/2023   Current use of proton pump inhibitor 08/26/2023   Diabetes mellitus screening 08/26/2023   Obesity (BMI 30-39.9) 04/15/2023   Severe major depressive disorder (HCC) 04/15/2023   Pain in buttock 08/18/2021   Heavy menses 07/06/2020   IBS (irritable bowel syndrome) 07/23/2016   Gastritis 05/25/2016   Routine general medical examination at a health care facility 03/11/2013   Encounter for routine gynecological examination 03/11/2013   Adjustment disorder with anxiety 02/11/2009   Past Medical History:  Diagnosis Date   Anemia    Anxiety    Depression    Encounter for initial prescription of injectable contraceptive 03/11/2017   Fundic gland polyps of stomach, benign    Hypoglycemia    Past Surgical History:  Procedure Laterality Date   INDUCED ABORTION     age 8   Social History   Tobacco Use   Smoking status: Never   Smokeless tobacco: Never  Vaping Use   Vaping status: Never Used  Substance Use Topics   Alcohol use: No    Alcohol/week: 0.0 standard drinks of alcohol   Drug use: No   Family History  Problem Relation Age of Onset   Diabetes Mother    Cancer Maternal Aunt        breast   Cancer Paternal Aunt        breast   Diabetes Father    Diabetes Maternal Grandmother        paternal grandmother   Breast cancer Maternal Aunt    Allergies  Allergen Reactions   Citric Acid Rash    Vaginal irritation   Latex Rash    Itching/Whelps   Current Outpatient Medications on File Prior to Visit  Medication Sig Dispense Refill   AUVELITY 45-105 MG TBCR Take 1 tablet by mouth in the morning and at bedtime.     Cholecalciferol (VITAMIN D3) 50 MCG (2000 UT) capsule Take 2,000 Units by mouth daily.     cholestyramine (QUESTRAN) 4 g packet Take 4 g by mouth in the morning  and at  bedtime.     cyanocobalamin  (VITAMIN B12) 1000 MCG tablet Take 2,000 mcg by mouth daily.     levonorgestrel  (MIRENA ) 20 MCG/DAY IUD 1 each by Intrauterine route once.     omeprazole  (PRILOSEC) 20 MG capsule Take 1 capsule (20 mg total) by mouth daily. 90 capsule 0   promethazine  (PHENERGAN ) 25 MG tablet TAKE 1 TABLET BY MOUTH EVERY 8 HOURS AS NEEDED FOR NAUSEA OR VOMITING. 30 tablet 3   Semaglutide-Weight Management (WEGOVY [place]) Inject 2.5 mg into the skin once a week.     traZODone  (DESYREL ) 50 MG tablet Take 1 tablet (50 mg total) by mouth at bedtime as needed. 90 tablet 2   tirzepatide  (ZEPBOUND ) 2.5 MG/0.5ML Pen Inject 2.5 mg into the skin once a week. (Patient not taking: Reported on 09/10/2023) 2 mL 0   No current facility-administered medications on file prior to visit.    Review of Systems  Constitutional:  Positive for fatigue. Negative for activity change, appetite change, fever and unexpected weight change.  HENT:  Negative for congestion, ear pain, rhinorrhea, sinus pressure and sore throat.   Eyes:  Negative for pain, redness and visual disturbance.  Respiratory:  Negative for cough, shortness of breath and wheezing.   Cardiovascular:  Negative for chest pain and palpitations.  Gastrointestinal:  Negative for abdominal pain, blood in stool, constipation and diarrhea.  Endocrine: Negative for polydipsia and polyuria.  Genitourinary:  Negative for dysuria, frequency and urgency.  Musculoskeletal:  Negative for arthralgias, back pain and myalgias.  Skin:  Negative for pallor and rash.  Allergic/Immunologic: Negative for environmental allergies.  Neurological:  Negative for dizziness, syncope and headaches.  Hematological:  Negative for adenopathy. Does not bruise/bleed easily.  Psychiatric/Behavioral:  Positive for dysphoric mood and sleep disturbance. Negative for suicidal ideas. The patient is nervous/anxious.        Objective:   Physical Exam Constitutional:       General: She is not in acute distress.    Appearance: Normal appearance. She is well-developed and normal weight. She is not ill-appearing or diaphoretic.  HENT:     Head: Normocephalic and atraumatic.     Right Ear: Tympanic membrane, ear canal and external ear normal.     Left Ear: Tympanic membrane, ear canal and external ear normal.     Nose: Nose normal. No congestion.     Mouth/Throat:     Mouth: Mucous membranes are moist.     Pharynx: Oropharynx is clear. No posterior oropharyngeal erythema.  Eyes:     General: No scleral icterus.    Extraocular Movements: Extraocular movements intact.     Conjunctiva/sclera: Conjunctivae normal.     Pupils: Pupils are equal, round, and reactive to light.  Neck:     Thyroid : No thyromegaly.     Vascular: No carotid bruit or JVD.  Cardiovascular:     Rate and Rhythm: Normal rate and regular rhythm.     Pulses: Normal pulses.     Heart sounds: Normal heart sounds.     No gallop.  Pulmonary:     Effort: Pulmonary effort is normal. No respiratory distress.     Breath sounds: Normal breath sounds. No wheezing.     Comments: Good air exch Chest:     Chest wall: No tenderness.  Abdominal:     General: Bowel sounds are normal. There is no distension or abdominal bruit.     Palpations: Abdomen is soft. There is no mass.     Tenderness: There is  no abdominal tenderness.     Hernia: No hernia is present.  Genitourinary:    Comments: Declines gyn exam today Musculoskeletal:        General: No tenderness. Normal range of motion.     Cervical back: Normal range of motion and neck supple. No rigidity. No muscular tenderness.     Right lower leg: No edema.     Left lower leg: No edema.     Comments: No kyphosis   Lymphadenopathy:     Cervical: No cervical adenopathy.  Skin:    General: Skin is warm and dry.     Coloration: Skin is not pale.     Findings: No erythema or rash.     Comments: Solar lentigines diffusely   Neurological:      Mental Status: She is alert. Mental status is at baseline.     Cranial Nerves: No cranial nerve deficit.     Motor: No abnormal muscle tone.     Coordination: Coordination normal.     Gait: Gait normal.     Deep Tendon Reflexes: Reflexes are normal and symmetric. Reflexes normal.  Psychiatric:        Mood and Affect: Mood normal.        Cognition and Memory: Cognition and memory normal.           Assessment & Plan:   Problem List Items Addressed This Visit       Digestive   IBS (irritable bowel syndrome)   Continues questran powder and it still helps      Gastritis   Continues omeprazole  Per pt GI did not have much more to offer         Other   Vitamin D  deficiency   Last vitamin D  Lab Results  Component Value Date   VD25OH 16.96 (L) 09/04/2023   Has started 2000 international units D3 daily  Discussed importance for bone and general health  Will re check in 2 months       Relevant Orders   VITAMIN D  25 Hydroxy (Vit-D Deficiency, Fractures)   Vitamin B12 deficiency   Lab Results  Component Value Date   VITAMINB12 193 (L) 09/04/2023   On ppi B12 shot today  Will continue B12 oral 2000 mcg daily  Re check 2 months  Hope this will help energy       Relevant Orders   Vitamin B12   Severe major depressive disorder (HCC)   Continues psychiatric care  Auvelty 45-105 mg bid Trazodone  for sleep 50 mg prn  No current SI  Considering possible ketamine? Treatment in future   Pt has good support  Encouraged good self care       Routine general medical examination at a health care facility - Primary   Reviewed health habits including diet and exercise and skin cancer prevention Reviewed appropriate screening tests for age  Also reviewed health mt list, fam hx and immunization status , as well as social and family history   See HPI Labs reviewed and ordered Health Maintenance  Topic Date Due   Pap with HPV screening  09/09/2024*   COVID-19 Vaccine (4 -  2024-25 season) 09/25/2024*   Flu Shot  11/08/2023   DTaP/Tdap/Td vaccine (3 - Td or Tdap) 02/22/2027   HPV Vaccine  Completed   Hepatitis C Screening  Completed   HIV Screening  Completed   Meningitis B Vaccine  Aged Out  *Topic was postponed. The date shown is not the original due  date.    Plans to start mammograms at 39  Declines pap and gyn exam today  Discussed fall prevention, supplements and exercise for bone density  PHQ 8 under care of psychiatry       Obesity (BMI 30-39.9)   Bmi is 28.3 No longer in obese category On low dose semaglutide- can no longer afford /not covered well enough after year is up   Prefers tirpetazide but not covered   Doing well with diet and exercise       Relevant Medications   Semaglutide-Weight Management (WEGOVY [place])   Heavy menses   Resolved with mirena  iud No periods  In past depo provera  caused weight gain      Diabetes mellitus screening   Lab Results  Component Value Date   HGBA1C 5.1 09/04/2023   HGBA1C 5.9 09/04/2018   Improved Commended  Taking glp-1 and losing weight       Current use of proton pump inhibitor   Both D and B12 are low  Last vitamin D  Lab Results  Component Value Date   VD25OH 16.96 (L) 09/04/2023   Lab Results  Component Value Date   VITAMINB12 193 (L) 09/04/2023    Have begun treating these       Adjustment disorder with anxiety   Ongoing psychiatric care Auvelity 45-105 mg bid (works better than pristiq) Trazodone  for sleep  PHQ high at 19  Not currently suicidal   Is considering ketamine treatment in future but apprehensive about it

## 2023-09-10 NOTE — Assessment & Plan Note (Signed)
 Lab Results  Component Value Date   VITAMINB12 193 (L) 09/04/2023   On ppi B12 shot today  Will continue B12 oral 2000 mcg daily  Re check 2 months  Hope this will help energy

## 2023-09-10 NOTE — Assessment & Plan Note (Signed)
 Bmi is 28.3 No longer in obese category On low dose semaglutide- can no longer afford /not covered well enough after year is up   Prefers tirpetazide but not covered   Doing well with diet and exercise

## 2023-09-10 NOTE — Assessment & Plan Note (Signed)
 Reviewed health habits including diet and exercise and skin cancer prevention Reviewed appropriate screening tests for age  Also reviewed health mt list, fam hx and immunization status , as well as social and family history   See HPI Labs reviewed and ordered Health Maintenance  Topic Date Due   Pap with HPV screening  09/09/2024*   COVID-19 Vaccine (4 - 2024-25 season) 09/25/2024*   Flu Shot  11/08/2023   DTaP/Tdap/Td vaccine (3 - Td or Tdap) 02/22/2027   HPV Vaccine  Completed   Hepatitis C Screening  Completed   HIV Screening  Completed   Meningitis B Vaccine  Aged Out  *Topic was postponed. The date shown is not the original due date.    Plans to start mammograms at 39  Declines pap and gyn exam today  Discussed fall prevention, supplements and exercise for bone density  PHQ 8 under care of psychiatry

## 2023-09-10 NOTE — Assessment & Plan Note (Signed)
 Ongoing psychiatric care Auvelity 45-105 mg bid (works better than pristiq) Trazodone  for sleep  PHQ high at 19  Not currently suicidal   Is considering ketamine treatment in future but apprehensive about it

## 2023-09-10 NOTE — Assessment & Plan Note (Signed)
 Both D and B12 are low  Last vitamin D  Lab Results  Component Value Date   VD25OH 16.96 (L) 09/04/2023   Lab Results  Component Value Date   VITAMINB12 193 (L) 09/04/2023    Have begun treating these

## 2023-10-29 ENCOUNTER — Encounter: Payer: Self-pay | Admitting: Family Medicine

## 2023-11-14 ENCOUNTER — Other Ambulatory Visit (INDEPENDENT_AMBULATORY_CARE_PROVIDER_SITE_OTHER)

## 2023-11-14 DIAGNOSIS — E559 Vitamin D deficiency, unspecified: Secondary | ICD-10-CM | POA: Diagnosis not present

## 2023-11-14 DIAGNOSIS — E538 Deficiency of other specified B group vitamins: Secondary | ICD-10-CM

## 2023-11-15 LAB — VITAMIN D 25 HYDROXY (VIT D DEFICIENCY, FRACTURES): VITD: 21.97 ng/mL — ABNORMAL LOW (ref 30.00–100.00)

## 2023-11-15 LAB — VITAMIN B12: Vitamin B-12: 737 pg/mL (ref 211–911)

## 2023-11-17 ENCOUNTER — Ambulatory Visit: Payer: Self-pay | Admitting: Family Medicine

## 2023-11-17 DIAGNOSIS — E559 Vitamin D deficiency, unspecified: Secondary | ICD-10-CM

## 2023-11-17 DIAGNOSIS — E538 Deficiency of other specified B group vitamins: Secondary | ICD-10-CM

## 2023-11-18 ENCOUNTER — Telehealth: Payer: Self-pay | Admitting: *Deleted

## 2023-11-18 NOTE — Telephone Encounter (Signed)
 Pt viewed labs results via mychart. Per Dr. Randeen pt needs a 3 month (non fasting) lab appt to recheck vit D and B12  Please schedule. Thanks

## 2023-11-18 NOTE — Telephone Encounter (Signed)
 Lvmtcb, sent mychart message

## 2024-03-16 ENCOUNTER — Ambulatory Visit: Admitting: Family Medicine

## 2024-04-13 ENCOUNTER — Ambulatory Visit: Admitting: Family Medicine

## 2024-05-05 ENCOUNTER — Other Ambulatory Visit: Payer: Self-pay | Admitting: Family Medicine

## 2024-05-06 ENCOUNTER — Other Ambulatory Visit: Payer: Self-pay

## 2024-05-06 ENCOUNTER — Other Ambulatory Visit (HOSPITAL_COMMUNITY): Payer: Self-pay

## 2024-05-06 MED ORDER — TRAZODONE HCL 50 MG PO TABS
50.0000 mg | ORAL_TABLET | Freq: Every evening | ORAL | 0 refills | Status: AC | PRN
  Filled 2024-05-06: qty 90, 90d supply, fill #0

## 2024-05-06 MED ORDER — OMEPRAZOLE 20 MG PO CPDR
20.0000 mg | DELAYED_RELEASE_CAPSULE | Freq: Every day | ORAL | 0 refills | Status: DC
  Filled 2024-05-06: qty 90, 90d supply, fill #0

## 2024-05-06 NOTE — Telephone Encounter (Signed)
 Trazodone  last filled on 11/21/21 #90 tab/ 2 refills   Omeprazole  last filled on 08/19/23 #90 tab/ 0 refills  Next OV is an acute appt on 05/07/24

## 2024-05-07 ENCOUNTER — Ambulatory Visit: Admitting: Family Medicine

## 2024-05-07 ENCOUNTER — Other Ambulatory Visit: Payer: Self-pay

## 2024-05-07 ENCOUNTER — Encounter: Payer: Self-pay | Admitting: Pharmacist

## 2024-05-07 ENCOUNTER — Encounter: Payer: Self-pay | Admitting: Family Medicine

## 2024-05-07 VITALS — BP 124/64 | HR 76 | Temp 98.3°F | Ht 62.25 in | Wt 139.5 lb

## 2024-05-07 DIAGNOSIS — R1031 Right lower quadrant pain: Secondary | ICD-10-CM

## 2024-05-07 DIAGNOSIS — K580 Irritable bowel syndrome with diarrhea: Secondary | ICD-10-CM

## 2024-05-07 NOTE — Assessment & Plan Note (Addendum)
 For 2 weeks in setting of worse constipation than usual (IBS)  No fever or vomiting (nausea is baseline)  Tenderness in RLQ on exam but no rebound or pain with movement  Differential includes constipation , subacute appendicitis   Reviewed last colonoscopy 2022 -normal  Last EGD 2018 -polyp (b9) fundic   Labs ordered  Pending results-will order CT abd/pelvis with contrast   Call back and Er precautions noted in detail today   If worse pain or any fever or new symptoms -will to to ER after hours  Orders:   CBC with Differential/Platelet   Basic metabolic panel with GFR   Hepatic function panel

## 2024-05-07 NOTE — Patient Instructions (Addendum)
 Drink clear fluids tonight/ sips    Lab now  We will reach out with results and likely get you set up for imaging (CT scan) tomorrow   If pain becomes severe tonight-go to the ER  Or if fever or vomiting or other new symptoms    If you think soy milk worsens constipation -then hold it

## 2024-05-07 NOTE — Assessment & Plan Note (Addendum)
 More constipation recently Only diet change is more soy milk  2 bm in 2 weeks Also RLQ discomfort (unsure if this is cause)   Last colonoscopy normal 2022

## 2024-05-07 NOTE — Progress Notes (Signed)
 "  Subjective:    Patient ID: Karen Sanchez, female    DOB: 1985-07-07, 39 y.o.   MRN: [id number]  HPI  Wt Readings from Last 3 Encounters:  05/07/24 139 lb 8 oz (63.3 kg)  09/10/23 156 lb 6 oz (70.9 kg)  04/15/23 180 lb (81.6 kg)   25.31 kg/m  Vitals:   05/07/24 1601  BP: 124/64  Pulse: 76  Temp: 98.3 F (36.8 C)  SpO2: 100%   Pt presents with c/o  Abdominal pain  Constipation    Has IBS Last 2 weeks -much more constipation  Persistent pain lower right abdomen Worse to cough and sneeze  Hurts to press on  Hurts to move   No fever  Nausea -is her baseline /chronic  No vomiting  No blood in stool or red stool No black stool No upper abdominal pain   Normal colonoscopy 2022  Normal egd 2018 =polyps fundic gland   Not pregnant  Has mirena  IUD   Patient Active Problem List   Diagnosis Date Noted   Right lower quadrant abdominal pain 05/07/2024   Vitamin B12 deficiency 09/10/2023   Vitamin D  deficiency 09/10/2023   Current use of proton pump inhibitor 08/26/2023   Diabetes mellitus screening 08/26/2023   Obesity (BMI 30-39.9) 04/15/2023   Severe major depressive disorder (HCC) 04/15/2023   Pain in buttock 08/18/2021   Heavy menses 07/06/2020   IBS (irritable bowel syndrome) 07/23/2016   Gastritis 05/25/2016   Routine general medical examination at a health care facility 03/11/2013   Encounter for routine gynecological examination 03/11/2013   Adjustment disorder with anxiety 02/11/2009   Past Medical History:  Diagnosis Date   Allergy    Anemia    Anxiety    Depression    Encounter for initial prescription of injectable contraceptive 03/11/2017   Fundic gland polyps of stomach, benign    Hypoglycemia    Past Surgical History:  Procedure Laterality Date   INDUCED ABORTION     age 55   Social History[1] Family History  Problem Relation Age of Onset   Diabetes Mother    Cancer Maternal Aunt        breast   Cancer Paternal Aunt         breast   Diabetes Father    Diabetes Maternal Grandmother        paternal grandmother   Breast cancer Maternal Aunt    Allergies[2] Medications Ordered Prior to Encounter[3]  Review of Systems  Constitutional:  Positive for fatigue. Negative for activity change, appetite change, fever and unexpected weight change.  HENT:  Negative for congestion, ear pain, rhinorrhea, sinus pressure and sore throat.   Eyes:  Negative for pain, redness and visual disturbance.  Respiratory:  Negative for cough, shortness of breath and wheezing.   Cardiovascular:  Negative for chest pain and palpitations.  Gastrointestinal:  Positive for abdominal pain, constipation and nausea. Negative for abdominal distention, anal bleeding, blood in stool, diarrhea and vomiting.       Nausea is her baseline   Endocrine: Negative for polydipsia and polyuria.  Genitourinary:  Negative for dysuria, frequency and urgency.  Musculoskeletal:  Negative for arthralgias, back pain and myalgias.  Skin:  Negative for pallor and rash.  Allergic/Immunologic: Negative for environmental allergies.  Neurological:  Negative for dizziness, syncope and headaches.  Hematological:  Negative for adenopathy. Does not bruise/bleed easily.  Psychiatric/Behavioral:  Negative for decreased concentration and dysphoric mood. The patient is not nervous/anxious.  Objective:   Physical Exam Constitutional:      General: She is not in acute distress.    Appearance: She is well-developed and normal weight. She is not ill-appearing or diaphoretic.  HENT:     Head: Normocephalic and atraumatic.  Eyes:     Conjunctiva/sclera: Conjunctivae normal.     Pupils: Pupils are equal, round, and reactive to light.  Neck:     Thyroid : No thyromegaly.     Vascular: No carotid bruit or JVD.  Cardiovascular:     Rate and Rhythm: Normal rate and regular rhythm.     Heart sounds: Normal heart sounds.     No gallop.  Pulmonary:     Effort:  Pulmonary effort is normal. No respiratory distress.     Breath sounds: Normal breath sounds. No wheezing or rales.  Abdominal:     General: Abdomen is flat. Bowel sounds are normal. There is no distension or abdominal bruit.     Palpations: Abdomen is soft. There is no shifting dullness, fluid wave, hepatomegaly, splenomegaly, mass or pulsatile mass.     Tenderness: There is abdominal tenderness in the right lower quadrant. There is no right CVA tenderness, left CVA tenderness, guarding or rebound. Positive signs include psoas sign. Negative signs include Murphy's sign and McBurney's sign.     Hernia: No hernia is present.     Comments: No pain with shaking table/patient    Musculoskeletal:     Cervical back: Normal range of motion and neck supple.     Right lower leg: No edema.     Left lower leg: No edema.  Lymphadenopathy:     Cervical: No cervical adenopathy.  Skin:    General: Skin is warm and dry.     Coloration: Skin is not pale.     Findings: No rash.  Neurological:     Mental Status: She is alert.     Coordination: Coordination normal.     Deep Tendon Reflexes: Reflexes are normal and symmetric. Reflexes normal.  Psychiatric:        Mood and Affect: Mood normal. Affect is blunt.           Assessment & Plan:   Assessment & Plan Right lower quadrant abdominal pain For 2 weeks in setting of worse constipation than usual (IBS)  No fever or vomiting (nausea is baseline)  Tenderness in RLQ on exam but no rebound or pain with movement  Differential includes constipation , subacute appendicitis   Reviewed last colonoscopy 2022 -normal  Last EGD 2018 -polyp (b9) fundic   Labs ordered  Pending results-will order CT abd/pelvis with contrast   Call back and Er precautions noted in detail today   If worse pain or any fever or new symptoms -will to to ER after hours  Orders:   CBC with Differential/Platelet   Basic metabolic panel with GFR   Hepatic function  panel  Irritable bowel syndrome with diarrhea More constipation recently Only diet change is more soy milk  2 bm in 2 weeks Also RLQ discomfort (unsure if this is cause)   Last colonoscopy normal 2022           [1]  Social History Tobacco Use   Smoking status: Never   Smokeless tobacco: Never  Vaping Use   Vaping status: Never Used  Substance Use Topics   Alcohol use: No    Alcohol/week: 0.0 standard drinks of alcohol   Drug use: No  [2]  Allergies Allergen Reactions  Citric Acid Rash    Vaginal irritation   Latex Rash    Itching/Whelps  [3]  Current Outpatient Medications on File Prior to Visit  Medication Sig Dispense Refill   AUVELITY 45-105 MG TBCR Take 1 tablet by mouth in the morning and at bedtime.     Cholecalciferol (VITAMIN D3) 50 MCG (2000 UT) capsule Take 2,000 Units by mouth daily.     cyanocobalamin  (VITAMIN B12) 1000 MCG tablet Take 2,000 mcg by mouth daily.     levonorgestrel  (MIRENA ) 20 MCG/DAY IUD 1 each by Intrauterine route once.     promethazine  (PHENERGAN ) 25 MG tablet TAKE 1 TABLET BY MOUTH EVERY 8 HOURS AS NEEDED FOR NAUSEA OR VOMITING. 30 tablet 3   Semaglutide-Weight Management (WEGOVY [place]) Inject 0.5 mg into the skin once a week.     traZODone  (DESYREL ) 50 MG tablet Take 1 tablet (50 mg total) by mouth at bedtime as needed. 90 tablet 0   No current facility-administered medications on file prior to visit.   "

## 2024-05-08 ENCOUNTER — Other Ambulatory Visit: Payer: Self-pay

## 2024-05-08 LAB — CBC WITH DIFFERENTIAL/PLATELET
Basophils Absolute: 0 10*3/uL (ref 0.0–0.1)
Basophils Relative: 0.6 % (ref 0.0–3.0)
Eosinophils Absolute: 0.1 10*3/uL (ref 0.0–0.7)
Eosinophils Relative: 1.8 % (ref 0.0–5.0)
HCT: 39.3 % (ref 36.0–46.0)
Hemoglobin: 13.4 g/dL (ref 12.0–15.0)
Lymphocytes Relative: 28.5 % (ref 12.0–46.0)
Lymphs Abs: 2.1 10*3/uL (ref 0.7–4.0)
MCHC: 34.1 g/dL (ref 30.0–36.0)
MCV: 86.6 fl (ref 78.0–100.0)
Monocytes Absolute: 0.9 10*3/uL (ref 0.1–1.0)
Monocytes Relative: 12.3 % — ABNORMAL HIGH (ref 3.0–12.0)
Neutro Abs: 4.2 10*3/uL (ref 1.4–7.7)
Neutrophils Relative %: 56.8 % (ref 43.0–77.0)
Platelets: 296 10*3/uL (ref 150.0–400.0)
RBC: 4.53 Mil/uL (ref 3.87–5.11)
RDW: 12.9 % (ref 11.5–15.5)
WBC: 7.3 10*3/uL (ref 4.0–10.5)

## 2024-05-08 LAB — HEPATIC FUNCTION PANEL
ALT: 24 U/L (ref 3–35)
AST: 16 U/L (ref 5–37)
Albumin: 4.3 g/dL (ref 3.5–5.2)
Alkaline Phosphatase: 63 U/L (ref 39–117)
Bilirubin, Direct: 0 mg/dL — ABNORMAL LOW (ref 0.1–0.3)
Total Bilirubin: 0.5 mg/dL (ref 0.2–1.2)
Total Protein: 6.8 g/dL (ref 6.0–8.3)

## 2024-05-08 LAB — BASIC METABOLIC PANEL WITH GFR
BUN: 8 mg/dL (ref 6–23)
CO2: 26 meq/L (ref 19–32)
Calcium: 9.2 mg/dL (ref 8.4–10.5)
Chloride: 102 meq/L (ref 96–112)
Creatinine, Ser: 0.68 mg/dL (ref 0.40–1.20)
GFR: 109.71 mL/min
Glucose, Bld: 81 mg/dL (ref 70–99)
Potassium: 4 meq/L (ref 3.5–5.1)
Sodium: 135 meq/L (ref 135–145)

## 2024-05-10 ENCOUNTER — Ambulatory Visit: Payer: Self-pay | Admitting: Family Medicine

## 2024-05-10 DIAGNOSIS — R1031 Right lower quadrant pain: Secondary | ICD-10-CM

## 2024-05-18 ENCOUNTER — Inpatient Hospital Stay: Admission: RE | Admit: 2024-05-18
